# Patient Record
Sex: Female | Born: 1945
Health system: Southern US, Community
[De-identification: ages and names within clinical notes are randomized; demographics above are authoritative.]

## PROBLEM LIST (undated history)

## (undated) DIAGNOSIS — Z9109 Other allergy status, other than to drugs and biological substances: Secondary | ICD-10-CM

## (undated) DIAGNOSIS — D649 Anemia, unspecified: Secondary | ICD-10-CM

## (undated) DIAGNOSIS — M1990 Unspecified osteoarthritis, unspecified site: Secondary | ICD-10-CM

## (undated) DIAGNOSIS — G8929 Other chronic pain: Secondary | ICD-10-CM

## (undated) DIAGNOSIS — K589 Irritable bowel syndrome without diarrhea: Secondary | ICD-10-CM

## (undated) HISTORY — PX: SHOULDER ARTHROSCOPY: SHX128

## (undated) HISTORY — PX: DILATION AND CURETTAGE OF UTERUS: SHX78

## (undated) HISTORY — PX: BREAST SURGERY: SHX581

---

## 1969-03-12 HISTORY — PX: TONSILLECTOMY: SUR1361

## 1996-03-12 HISTORY — PX: ABDOMINAL HYSTERECTOMY: SHX81

## 1998-05-31 ENCOUNTER — Other Ambulatory Visit: Admission: RE | Admit: 1998-05-31 | Discharge: 1998-05-31 | Payer: Self-pay | Admitting: Obstetrics and Gynecology

## 1999-03-13 HISTORY — PX: APPENDECTOMY: SHX54

## 1999-06-20 ENCOUNTER — Other Ambulatory Visit: Admission: RE | Admit: 1999-06-20 | Discharge: 1999-06-20 | Payer: Self-pay | Admitting: Obstetrics and Gynecology

## 1999-09-20 ENCOUNTER — Ambulatory Visit (HOSPITAL_COMMUNITY): Admission: RE | Admit: 1999-09-20 | Discharge: 1999-09-20 | Payer: Self-pay | Admitting: Gastroenterology

## 1999-10-19 ENCOUNTER — Encounter: Payer: Self-pay | Admitting: *Deleted

## 1999-10-19 ENCOUNTER — Inpatient Hospital Stay (HOSPITAL_COMMUNITY): Admission: EM | Admit: 1999-10-19 | Discharge: 1999-10-20 | Payer: Self-pay | Admitting: *Deleted

## 1999-10-24 ENCOUNTER — Encounter: Admission: RE | Admit: 1999-10-24 | Discharge: 1999-10-24 | Payer: Self-pay | Admitting: Sports Medicine

## 2003-08-02 ENCOUNTER — Other Ambulatory Visit: Admission: RE | Admit: 2003-08-02 | Discharge: 2003-08-02 | Payer: Self-pay | Admitting: Obstetrics and Gynecology

## 2004-01-22 ENCOUNTER — Ambulatory Visit (HOSPITAL_COMMUNITY): Admission: RE | Admit: 2004-01-22 | Discharge: 2004-01-22 | Payer: Self-pay | Admitting: Sports Medicine

## 2008-07-17 ENCOUNTER — Emergency Department (HOSPITAL_COMMUNITY): Admission: EM | Admit: 2008-07-17 | Discharge: 2008-07-17 | Payer: Self-pay | Admitting: Emergency Medicine

## 2010-06-20 LAB — URINALYSIS, ROUTINE W REFLEX MICROSCOPIC
Bilirubin Urine: NEGATIVE
Glucose, UA: NEGATIVE mg/dL
Hgb urine dipstick: NEGATIVE
Ketones, ur: NEGATIVE mg/dL
Nitrite: NEGATIVE
Protein, ur: NEGATIVE mg/dL
Specific Gravity, Urine: 1.028 (ref 1.005–1.030)
Urobilinogen, UA: 0.2 mg/dL (ref 0.0–1.0)
pH: 5 (ref 5.0–8.0)

## 2010-07-28 NOTE — Discharge Summary (Signed)
[place]. [facility]  Patient:    April Simmons, April Simmons                       MRN: [id number] Adm. Date:  [id number] Disc. Date: [id number] Attending:  Garnette Scheuermann                           Discharge Summary  DISCHARGE DIAGNOSIS:  Acute appendicitis.  DISCHARGE MEDICATIONS:  Vicodin one to two tablets every four hours p.r.n. for pain.  DIET:  Regular.  CONDITION ON DISCHARGE:  Stable.  ACTIVITY:  As tolerated.  WOUND CARE:  She can shower and pat her wound dry.  HISTORY OF PRESENT ILLNESS:  The patient is a 65 year old female with abdominal pain and an unusual history for acute appendicitis.  A CT scan was done which showed some thickening of her small bowel loops along the terminal ileum and of her colon and a possible thickened appendix.  With these findings, it was decided that a laparoscopic examination and appendectomy would be required.  HOSPITAL COURSE:  She was taken to the OR where she was found to have at thickened appendix with possible appendolith and mild inflammation, maybe early acute appendicitis.  No other inflammation was noted.  A laparoscopic appendectomy was done without event.  Subsequently she did well, voiding well and eating well with her pain being relieved from the procedure.  DISPOSITION:  She was discharged home on Vicodin only and she will return to see me on October 31, 1999. DD:  10/20/99 TD:  10/21/99 Job: [id number]

## 2010-07-28 NOTE — H&P (Signed)
[place]. [facility]  Patient:    April Simmons, April Simmons                       MRN: [id number] Adm. Date:  [id number] Disc. Date: [id number] Attending:  Garnette Scheuermann Dictator:   Kinnie Scales Reed Breech, M.D.                         History and Physical  ACUTE PROBLEMS: 1. Right lower quadrant pain. 2. Vomiting. 3. Diarrhea.  CHRONIC PROBLEMS: 1. Status post surgical menopause hysterectomy in 1998. 2. Status post tonsillectomy and adenoidectomy. 3. Status post bilateral tubal ligation in 1990s. 4. Four breast surgeries for fibroadenoma by Dr. Maryagnes Amos. 5. Status post colonoscopy three weeks ago which was reported to be clear. 6. Status post three dilatation and curettages. 7. Two vaginal deliveries.  CHIEF COMPLAINT:  Abdominal pain, vomiting and diarrhea.  HISTORY OF PRESENT ILLNESS:  A pleasant 65 year old female with one week of intermittent right lower quadrant pain associated with this a.m. diarrhea, gas and profound discomfort.  These problems were difficult for her tolerate during this time and she is not taking any pain medications.  She was evaluated this a.m. at [facility] and sent for further evaluation at [facility].  This a.m. her pain was worse than she has experienced and she also had yellow emesis without hematemesis noted. Diarrhea is described with each of these episodes to be greater than five to six each time and soft to watery consistency without bilious stool.  She received Toradol this a.m. at [facility] with some relief.  PAST MEDICAL HISTORY:  As above.  MEDICATIONS:  Estrace, Citrucel, vitamin E, multivitamin and fiber.  ALLERGIES:  Codeine causes upset stomach.  SOCIAL HISTORY:  No tobacco or alcohol use.  Two children. Married for 29 years.  FAMILY HISTORY:  Mother died of Parkinsons, breast cancer and diabetes mellitus.  Father died of myocardial infarction at age 35. Brother died of MI at age  33.  REVIEW OF SYSTEMS:  Positive for chills.  No fevers. No myalgias.  Positive vomiting today.  Minimal nausea.  No weight changes.  Anorexia for the last several days.  No headaches and as above.  PHYSICAL EXAMINATION:  VITAL SIGNS:  Temperature 97.0, blood pressure 127/73, pulse 72, respirations 20, oxygen saturation 96% on room air.  GENERAL: Pleasant in moderate discomfort.  HEENT: PERRLA. EOMI.  Oropharynx nonerythematous.  NECK:  No lymphadenopathy in anterior, posterior, or cervical chain. No JVD.  CHEST: Clear to auscultation bilaterally.  CARDIOVASCULAR: Regular rate and rhythm without murmurs.  Normal S1 and S2.  ABDOMEN: Positive right lower quadrant tenderness over McBurneys point. Positive bowel sounds.  Negative psoas sign and obturator sign.  No hepatosplenomegaly.  RECTAL:  Normal tone. No hemorrhoids.  Fecal occult blood test.  PELVIC:  Bimanual positive tenderness right lower quadrant.  EXTREMITIES: No clubbing, cyanosis or edema.  LABORATORY DATA:  WBC 16.4.  Platelets 228.  Sodium 137.  Potassium 3.7. Chloride 98.  Bicarbonate 32.  BUN 17.  Creatinine 0.9.  Glucose 123.  Total bilirubin 0.6.  AST 27.  ALT 28. Alkaline phosphatase 62.  Amylase 70.  Lipase 28.  CT scan pending.  ASSESSMENT/PLAN:  A 65 year old female with abdominal pain.  1. Abdominal pain, right lower quadrant.  Suspect appendicitis especially    with chronicity, increased WBCs and location without radiation. Other    considerations  are Crohns, fecalith, bowel obstruction, bowel irritation    and ischemia, cecal diverticulitis, psoas abscess.  We will await the    CT scan and call her surgeon, Dr. Maryagnes Amos our partner for evaluation.    She also has had a total hysterectomy with bilateral salpingo-oophorectomy    and thus gynecology etiology is less likely.  For pain we will treat    with Toradol and offer her morphine sulfate p.r.n. 2. Fluids, electrolytes and nutrition.  IV fluid for  rehydration.    Electrolytes okay at this point.  Can follow up with that. DD:  10/20/99 TD:  10/20/99 Job: [id number]

## 2010-07-28 NOTE — Procedures (Signed)
[place]. [facility]  Patient:    April Simmons, April Simmons                       MRN: [id number] Proc. Date: 09/20/99 Adm. Date:  [id number] Attending:  Charna Elizabeth CC:         Beather Arbour. Thomasena Edis, M.D.                           Procedure Report  DATE OF BIRTH:  [birth year]  REFERRING PHYSICIAN:  Beather Arbour. Thomasena Edis, M.D.  PROCEDURE PERFORMED:  Colonoscopy.  ENDOSCOPIST:  Anselmo Rod, M.D.  INSTRUMENT USED:  Olympus video colonoscope.  INDICATIONS:  A 65 year old female with a history of breast cancer in the family and blood in stool.  Rule out colonic polyps, masses, hemorrhoids, etc.  PREPROCEDURE PREPARATION:  Informed consent was procured from the patient. The patient was fasted for 8 hours prior to the procedure and prepped with a bottle of magnesium citrate and a gallon of NuLytely the night prior to the procedure.  PREPROCEDURE PHYSICAL:  Patient has stable vital signs.  NECK: Supple.  CHEST:  Clear to auscultation. S1, S2 regular.  ABDOMEN:  Soft with normal abdominal bowel sounds.  DESCRIPTION OF PROCEDURE:  The patient was placed in left lateral decubitus position and sedated with 75 mg of Demerol and 7 mg of Versed intravenously. Once the patient was adequately sedated and maintained on low-flow oxygen and continuous cardiac monitoring, the Olympus video colonoscope was advanced from the rectum to the cecum without difficulty.  No ulceration, erosions, masses, polyps or diverticula were seen.  There was no evidence of hemorrhoids.  The patient tolerated the procedure well without complication.  IMPRESSION:  Normal colonoscopy.  RECOMMENDATIONS:  Outpatient follow-up was advised for repeat guaiac testing. Further recommendations will be made at that time. DD:  09/20/99 TD:  09/20/99 Job: [id number]

## 2010-07-28 NOTE — Op Note (Signed)
[place]. [facility]  Patient:    April Simmons, April Simmons                       MRN: [id number] Proc. Date: 10/19/99 Adm. Date:  [id number] Disc. Date: [id number] Attending:  Garnette Scheuermann CC:         Anselmo Rod, M.D.   Operative Report  PREOPERATIVE DIAGNOSIS:  Possible early appendicitis and/or inflammatory bowel disease.  POSTOPERATIVE DIAGNOSIS:  Early acute appendicitis.  OPERATION PERFORMED:  Laparoscopic appendectomy.  SURGEON:  Jimmye Norman, M.D.  ASSISTANT:  None.  ANESTHESIA:  General endotracheal.  ESTIMATED BLOOD LOSS:  Less than 10 cc.  COMPLICATIONS:  None.  CONDITION:  Stable.  INDICATIONS FOR PROCEDURE:  The patient is a 65 year old female with abdominal pain in the right lower quadrant associated with nausea, no fevers and a white blood cell count of 16,000.  CT scan suggested acute appendicitis.  She was taken to the operating room for diagnostic laparoscopy and laparoscopic appendectomy.  FINDINGS:  The patient had a thickened, firm appendix, possible early acute appendicitis with some type of appendolith.  There was no serosal exudate. There was minimal fluid in the pelvis or in the right lower quadrant.  The inflammatory findings noted on CT scan were not necessarily present at the time of surgery.  DESCRIPTION OF PROCEDURE:  The patient was taken to the operating room and placed on the table in supine position.  After an adequate general anesthetic was administered, she was prepped and draped in the usual sterile manner exposing the midline, right upper quadrant and right lower quadrant of the abdomen.  A superumbilical curvilinear incision was made with the patient in Trendelenburg position down to the midline fascia through which a Veress needle was passed into the peritoneal cavity while tenting it up on the anterior abdominal wall with sharp towel clamps.  Once this was done, the Veress needle was confirmed to  be in adequate position using the saline test. We then insufflated carbon dioxide gas into the intraperitoneal cavity up to a maximal intra-abdominal pressure of 15 mmHg.  Once this was done, the right upper quadrant 5 mm cannula was passed into the peritoneal cavity.  With the superumbilical cannula which had been confirmed to be in position with the laparoscope ____________ camera and light source was in place and the 5 mm cannula, we were able to mobilize the cecum well enough into the site of the laparoscope and the camera in order to identify the appendix.  It was noted that the appendix did appear to be firm and enlarged and therefore, the superpubic 11-12 mm cannula was passed into the peritoneal cavity under direct vision.  There was minimal difficulty with that.  An adaptor was placed.  Using the appropriate instruments, the base of the appendix, the mesoappendix and the base of the appendix were dissected out.  We passed a vascular endoclip around the mesoappendix and stapled it off and transected it and then a GIA stapler along the base of the cecum and the appendix stapling it off. We then brought it out through the 10-11 mm cannula without contaminating the underlying subcutaneous tissues.  Once this was done, we irrigated the right lower quadrant with copious amounts of saline.  There was some bleeding from the superpubic catheter and cannula site and this was not enough in order to cauterize or suture.  Once we had the appendix out, we allowed all gas to escape  from the abdomen after irrigating the right lower quadrant with saline. Once this was done we injected all sites with 0.25% Marcaine with epinephrine and closed the skin using a running subcuticular stitch of 5-0 Vicryl suture. All sponge, needle and instrument counts were correct. DD:  10/19/99 TD:  10/20/99 Job: [id number]

## 2010-07-28 NOTE — Consult Note (Signed)
[place]. [facility]  Patient:    April Simmons, April Simmons                       MRN: [id number] Proc. Date: 10/19/99 Adm. Date:  [id number] Attending:  Garnette Scheuermann Dictator:   [id number] CC:         Sheppard Penton. Stacie Acres, M.D.  Kinnie Scales. Reed Breech, M.D.   Consultation Report  (TIME DICTATED: 1740)  REASON FOR CONSULTATION: The patient is a 65 year old woman with right lower quadrant abdominal pain and elevated WBC.  HISTORY OF PRESENT ILLNESS: The patient was in her usual state of good health until today when she developed abdominal pain at approximately 6 a.m. associated with multiple episodes of watery diarrhea.  She also experienced some chills, questionable fever, and right lower quadrant pain.  With these findings her pain increased.  It was not cramping type pain and did not seem to get better with bowel movement or with nausea.  She developed pain so severe that she could not lay down flat, which tended to worsen the pain.  She went to her primary care physicians office, who gave her a shot of Toradol and then sent her to the ER for evaluation for possible acute appendicitis.  PAST MEDICAL HISTORY: Pretty much unremarkable.  She has fibrocystic changes in the breast and has had multiple fibroadenomas removed.  She has otherwise been healthy.  She had a colonoscopy approximately three weeks to a month ago by Dr. Anselmo Rod, which was negative.  PAST SURGICAL HISTORY:  1. Tonsillectomy and adenoidectomy.  2. Dilation and curettage x 3.  3. Hysterectomy and total salpingo-oophorectomy.  4. Breast surgeries as mentioned previously.  5. Two spontaneous vaginal deliveries.  MEDICATIONS:  1. Estrace.  2. Citracal.  3. Vitamin E.  4. Multivitamin.  ALLERGIES: No known drug allergies.  PHYSICAL EXAMINATION:  VITAL SIGNS: Afebrile now, vital signs stable.  GENERAL: Her pain now is only 2/10 but she has received two doses of Toradol.  HEENT: Head  normocephalic, atraumatic.  Sclerae anicteric.  NECK: Supple.  CHEST: Clear.  ABDOMEN: Soft, though she does have some mild grimacing when palpated in the right lower quadrant.  Tenderness is more medial and inferior to the usual McBurneys point.  PELVIC: Examination by the ED physicians and medical resident demonstrated slight right adnexal tenderness and right lower quadrant pain.  RECTAL: Unremarkable.  LABORATORY DATA: WBC 16,000 with left shift, hematocrit normal, platelet count normal.  Electrolytes within normal limits.  Liver function tests normal. Amylase and lipase normal.  IMPRESSION: Very likely acute appendicitis in this 65 year old female; however, her story is atypical and her pain is not in the place you would expect it to be.  She currently has an appetite but has not eaten anything. She will benefit from CT scan being done to rule out acute appendicitis, and this is currently in the process. DD:  10/19/99 TD:  10/20/99 Job: [id number]

## 2010-11-08 ENCOUNTER — Ambulatory Visit (HOSPITAL_BASED_OUTPATIENT_CLINIC_OR_DEPARTMENT_OTHER): Payer: 59

## 2011-04-02 DIAGNOSIS — N760 Acute vaginitis: Secondary | ICD-10-CM | POA: Insufficient documentation

## 2012-02-19 DIAGNOSIS — N760 Acute vaginitis: Secondary | ICD-10-CM | POA: Diagnosis not present

## 2012-02-19 DIAGNOSIS — N898 Other specified noninflammatory disorders of vagina: Secondary | ICD-10-CM | POA: Diagnosis not present

## 2012-03-13 DIAGNOSIS — M722 Plantar fascial fibromatosis: Secondary | ICD-10-CM | POA: Diagnosis not present

## 2012-03-13 DIAGNOSIS — I10 Essential (primary) hypertension: Secondary | ICD-10-CM | POA: Diagnosis not present

## 2012-03-20 DIAGNOSIS — M722 Plantar fascial fibromatosis: Secondary | ICD-10-CM | POA: Diagnosis not present

## 2012-03-24 DIAGNOSIS — M722 Plantar fascial fibromatosis: Secondary | ICD-10-CM | POA: Diagnosis not present

## 2012-03-26 DIAGNOSIS — M722 Plantar fascial fibromatosis: Secondary | ICD-10-CM | POA: Diagnosis not present

## 2012-03-27 DIAGNOSIS — M722 Plantar fascial fibromatosis: Secondary | ICD-10-CM | POA: Diagnosis not present

## 2012-03-28 DIAGNOSIS — M722 Plantar fascial fibromatosis: Secondary | ICD-10-CM | POA: Diagnosis not present

## 2012-03-31 DIAGNOSIS — M722 Plantar fascial fibromatosis: Secondary | ICD-10-CM | POA: Diagnosis not present

## 2012-04-02 DIAGNOSIS — M722 Plantar fascial fibromatosis: Secondary | ICD-10-CM | POA: Diagnosis not present

## 2012-04-04 DIAGNOSIS — M722 Plantar fascial fibromatosis: Secondary | ICD-10-CM | POA: Diagnosis not present

## 2012-04-07 DIAGNOSIS — M722 Plantar fascial fibromatosis: Secondary | ICD-10-CM | POA: Diagnosis not present

## 2012-04-11 DIAGNOSIS — M722 Plantar fascial fibromatosis: Secondary | ICD-10-CM | POA: Diagnosis not present

## 2012-04-14 DIAGNOSIS — M722 Plantar fascial fibromatosis: Secondary | ICD-10-CM | POA: Diagnosis not present

## 2012-04-18 DIAGNOSIS — M722 Plantar fascial fibromatosis: Secondary | ICD-10-CM | POA: Diagnosis not present

## 2012-05-14 DIAGNOSIS — L821 Other seborrheic keratosis: Secondary | ICD-10-CM | POA: Diagnosis not present

## 2012-05-14 DIAGNOSIS — L600 Ingrowing nail: Secondary | ICD-10-CM | POA: Diagnosis not present

## 2012-05-14 DIAGNOSIS — I781 Nevus, non-neoplastic: Secondary | ICD-10-CM | POA: Diagnosis not present

## 2012-05-14 DIAGNOSIS — D239 Other benign neoplasm of skin, unspecified: Secondary | ICD-10-CM | POA: Diagnosis not present

## 2012-05-15 DIAGNOSIS — N951 Menopausal and female climacteric states: Secondary | ICD-10-CM | POA: Diagnosis not present

## 2012-05-15 DIAGNOSIS — N898 Other specified noninflammatory disorders of vagina: Secondary | ICD-10-CM | POA: Diagnosis not present

## 2012-05-15 DIAGNOSIS — N760 Acute vaginitis: Secondary | ICD-10-CM | POA: Diagnosis not present

## 2012-05-27 DIAGNOSIS — L03039 Cellulitis of unspecified toe: Secondary | ICD-10-CM | POA: Diagnosis not present

## 2012-05-27 DIAGNOSIS — L600 Ingrowing nail: Secondary | ICD-10-CM | POA: Diagnosis not present

## 2012-07-21 DIAGNOSIS — Z1231 Encounter for screening mammogram for malignant neoplasm of breast: Secondary | ICD-10-CM | POA: Diagnosis not present

## 2012-08-06 DIAGNOSIS — K1370 Unspecified lesions of oral mucosa: Secondary | ICD-10-CM | POA: Diagnosis not present

## 2012-08-12 DIAGNOSIS — M5412 Radiculopathy, cervical region: Secondary | ICD-10-CM | POA: Diagnosis not present

## 2012-08-12 DIAGNOSIS — M719 Bursopathy, unspecified: Secondary | ICD-10-CM | POA: Diagnosis not present

## 2012-08-12 DIAGNOSIS — M67919 Unspecified disorder of synovium and tendon, unspecified shoulder: Secondary | ICD-10-CM | POA: Diagnosis not present

## 2012-08-22 DIAGNOSIS — M5412 Radiculopathy, cervical region: Secondary | ICD-10-CM | POA: Diagnosis not present

## 2012-08-28 DIAGNOSIS — M47812 Spondylosis without myelopathy or radiculopathy, cervical region: Secondary | ICD-10-CM | POA: Diagnosis not present

## 2012-09-03 DIAGNOSIS — M47812 Spondylosis without myelopathy or radiculopathy, cervical region: Secondary | ICD-10-CM | POA: Diagnosis not present

## 2012-09-11 DIAGNOSIS — M47812 Spondylosis without myelopathy or radiculopathy, cervical region: Secondary | ICD-10-CM | POA: Diagnosis not present

## 2012-09-23 DIAGNOSIS — M719 Bursopathy, unspecified: Secondary | ICD-10-CM | POA: Diagnosis not present

## 2012-09-23 DIAGNOSIS — M67919 Unspecified disorder of synovium and tendon, unspecified shoulder: Secondary | ICD-10-CM | POA: Diagnosis not present

## 2012-09-23 DIAGNOSIS — M542 Cervicalgia: Secondary | ICD-10-CM | POA: Diagnosis not present

## 2012-09-29 DIAGNOSIS — N898 Other specified noninflammatory disorders of vagina: Secondary | ICD-10-CM | POA: Diagnosis not present

## 2012-09-29 DIAGNOSIS — N760 Acute vaginitis: Secondary | ICD-10-CM | POA: Diagnosis not present

## 2012-09-29 DIAGNOSIS — N951 Menopausal and female climacteric states: Secondary | ICD-10-CM | POA: Diagnosis not present

## 2012-09-30 DIAGNOSIS — M5020 Other cervical disc displacement, unspecified cervical region: Secondary | ICD-10-CM | POA: Diagnosis not present

## 2012-09-30 DIAGNOSIS — M542 Cervicalgia: Secondary | ICD-10-CM | POA: Diagnosis not present

## 2012-10-07 DIAGNOSIS — M542 Cervicalgia: Secondary | ICD-10-CM | POA: Diagnosis not present

## 2012-10-07 DIAGNOSIS — M5020 Other cervical disc displacement, unspecified cervical region: Secondary | ICD-10-CM | POA: Diagnosis not present

## 2012-10-09 DIAGNOSIS — M5020 Other cervical disc displacement, unspecified cervical region: Secondary | ICD-10-CM | POA: Diagnosis not present

## 2012-10-09 DIAGNOSIS — M542 Cervicalgia: Secondary | ICD-10-CM | POA: Diagnosis not present

## 2012-10-09 DIAGNOSIS — Z780 Asymptomatic menopausal state: Secondary | ICD-10-CM | POA: Diagnosis not present

## 2012-10-10 DIAGNOSIS — M5412 Radiculopathy, cervical region: Secondary | ICD-10-CM | POA: Diagnosis not present

## 2012-10-10 DIAGNOSIS — M47812 Spondylosis without myelopathy or radiculopathy, cervical region: Secondary | ICD-10-CM | POA: Diagnosis not present

## 2012-10-17 DIAGNOSIS — M47812 Spondylosis without myelopathy or radiculopathy, cervical region: Secondary | ICD-10-CM | POA: Diagnosis not present

## 2012-11-14 DIAGNOSIS — M47812 Spondylosis without myelopathy or radiculopathy, cervical region: Secondary | ICD-10-CM | POA: Diagnosis not present

## 2012-11-18 DIAGNOSIS — M542 Cervicalgia: Secondary | ICD-10-CM | POA: Diagnosis not present

## 2012-11-18 DIAGNOSIS — Z23 Encounter for immunization: Secondary | ICD-10-CM | POA: Diagnosis not present

## 2012-11-18 DIAGNOSIS — G4700 Insomnia, unspecified: Secondary | ICD-10-CM | POA: Diagnosis not present

## 2012-11-18 DIAGNOSIS — Z791 Long term (current) use of non-steroidal anti-inflammatories (NSAID): Secondary | ICD-10-CM | POA: Diagnosis not present

## 2012-11-21 DIAGNOSIS — Z791 Long term (current) use of non-steroidal anti-inflammatories (NSAID): Secondary | ICD-10-CM | POA: Diagnosis not present

## 2012-11-21 DIAGNOSIS — G4700 Insomnia, unspecified: Secondary | ICD-10-CM | POA: Diagnosis not present

## 2012-11-21 DIAGNOSIS — E781 Pure hyperglyceridemia: Secondary | ICD-10-CM | POA: Diagnosis not present

## 2012-11-21 DIAGNOSIS — M542 Cervicalgia: Secondary | ICD-10-CM | POA: Diagnosis not present

## 2012-12-01 DIAGNOSIS — M542 Cervicalgia: Secondary | ICD-10-CM | POA: Diagnosis not present

## 2012-12-02 ENCOUNTER — Other Ambulatory Visit: Payer: Self-pay | Admitting: Neurological Surgery

## 2012-12-08 DIAGNOSIS — E7800 Pure hypercholesterolemia, unspecified: Secondary | ICD-10-CM | POA: Diagnosis not present

## 2012-12-08 DIAGNOSIS — M542 Cervicalgia: Secondary | ICD-10-CM | POA: Diagnosis not present

## 2012-12-08 DIAGNOSIS — G4700 Insomnia, unspecified: Secondary | ICD-10-CM | POA: Diagnosis not present

## 2012-12-29 ENCOUNTER — Encounter (HOSPITAL_COMMUNITY): Payer: Self-pay | Admitting: Pharmacy Technician

## 2012-12-30 ENCOUNTER — Ambulatory Visit (HOSPITAL_COMMUNITY)
Admission: RE | Admit: 2012-12-30 | Discharge: 2012-12-30 | Disposition: A | Discharge status: Home / Self Care | Payer: Medicare Other | Source: Ambulatory Visit | Attending: Neurological Surgery | Admitting: Neurological Surgery

## 2012-12-30 ENCOUNTER — Encounter (HOSPITAL_COMMUNITY): Payer: Self-pay

## 2012-12-30 ENCOUNTER — Encounter (HOSPITAL_COMMUNITY)
Admission: RE | Admit: 2012-12-30 | Discharge: 2012-12-30 | Disposition: A | Discharge status: Home / Self Care | Payer: Medicare Other | Source: Ambulatory Visit | Attending: Neurological Surgery | Admitting: Neurological Surgery

## 2012-12-30 DIAGNOSIS — I498 Other specified cardiac arrhythmias: Secondary | ICD-10-CM | POA: Diagnosis not present

## 2012-12-30 DIAGNOSIS — I252 Old myocardial infarction: Secondary | ICD-10-CM | POA: Diagnosis not present

## 2012-12-30 DIAGNOSIS — Z01818 Encounter for other preprocedural examination: Secondary | ICD-10-CM | POA: Diagnosis not present

## 2012-12-30 DIAGNOSIS — Z01812 Encounter for preprocedural laboratory examination: Secondary | ICD-10-CM | POA: Insufficient documentation

## 2012-12-30 HISTORY — DX: Unspecified osteoarthritis, unspecified site: M19.90

## 2012-12-30 HISTORY — DX: Other allergy status, other than to drugs and biological substances: Z91.09

## 2012-12-30 LAB — BASIC METABOLIC PANEL
BUN: 18 mg/dL (ref 6–23)
CO2: 25 mEq/L (ref 19–32)
Calcium: 10.4 mg/dL (ref 8.4–10.5)
Chloride: 102 mEq/L (ref 96–112)
Creatinine, Ser: 0.97 mg/dL (ref 0.50–1.10)
GFR calc Af Amer: 70 mL/min — ABNORMAL LOW (ref >90)
GFR calc non Af Amer: 60 mL/min — ABNORMAL LOW (ref >90)
Glucose, Bld: 86 mg/dL (ref 70–99)
Potassium: 3.7 mEq/L (ref 3.5–5.1)
Sodium: 138 mEq/L (ref 135–145)

## 2012-12-30 LAB — CBC WITH DIFFERENTIAL/PLATELET
Basophils Absolute: 0 10*3/uL (ref 0.0–0.1)
Basophils Relative: 0 % (ref 0–1)
Eosinophils Absolute: 0.2 10*3/uL (ref 0.0–0.7)
Eosinophils Relative: 3 % (ref 0–5)
HCT: 35.9 % — ABNORMAL LOW (ref 36.0–46.0)
Hemoglobin: 12.2 g/dL (ref 12.0–15.0)
Lymphocytes Relative: 41 % (ref 12–46)
Lymphs Abs: 1.9 10*3/uL (ref 0.7–4.0)
MCH: 29.8 pg (ref 26.0–34.0)
MCHC: 34 g/dL (ref 30.0–36.0)
MCV: 87.8 fL (ref 78.0–100.0)
Monocytes Absolute: 0.5 10*3/uL (ref 0.1–1.0)
Monocytes Relative: 10 % (ref 3–12)
Neutro Abs: 2.1 10*3/uL (ref 1.7–7.7)
Neutrophils Relative %: 46 % (ref 43–77)
Platelets: 236 10*3/uL (ref 150–400)
RBC: 4.09 MIL/uL (ref 3.87–5.11)
RDW: 13.6 % (ref 11.5–15.5)
WBC: 4.6 10*3/uL (ref 4.0–10.5)

## 2012-12-30 LAB — PROTIME-INR
INR: 0.88 (ref 0.00–1.49)
Prothrombin Time: 11.8 seconds (ref 11.6–15.2)

## 2012-12-30 LAB — SURGICAL PCR SCREEN
MRSA, PCR: NEGATIVE
Staphylococcus aureus: POSITIVE — AB

## 2012-12-30 NOTE — Pre-Procedure Instructions (Signed)
CASEY MAXFIELD  12/30/2012   Your procedure is scheduled on:  Monday , October 29 th at 0830 AM  Report to [facility]" and check in with admitting at 0630 AM.  Call this number if you have problems the morning of surgery: [phone]   Remember:   Do not eat food or drink liquids after midnight.   Take these medicines the morning of surgery with A SIP OF WATER: Tramadol if needed for pain               Stop all Aspirin, Herbal medications, NSAID's (Relafen), and Vitamins 7 days prior to surgery.   Do not wear jewelry, make-up or nail polish.  Do not wear lotions, powders, or perfumes. You may wear deodorant.  Do not shave 48 hours prior to surgery. Men may shave face and neck.  Do not bring valuables to the hospital.  [facility] is not responsible  for any belongings or valuables.               Contacts, dentures or bridgework may not be worn into surgery.  Leave suitcase in the car. After surgery it may be brought to your room.  For patients admitted to the hospital, discharge time is determined by your treatment team.               Patients discharged the day of surgery will not be allowed to drive  home.   Special Instructions: Shower using CHG 2 nights before surgery and the night before surgery.  If you shower the day of surgery use CHG.  Use special wash - you have one bottle of CHG for all showers.  You should use approximately 1/3 of the bottle for each shower.   Please read over the following fact sheets that you were given: Pain Booklet, Coughing and Deep Breathing, MRSA Information and Surgical Site Infection Prevention

## 2012-12-31 NOTE — Progress Notes (Addendum)
Anesthesia Chart Review:  Patient is a 67 year old female scheduled for C4-5, C5-6 ACDF on 01/07/13 by Dr. Yetta Barre.  History includes former smoker, arthritis, multiple environmental allergies, tonsillectomy, hysterectomy, appendectomy '01, breast surgery X 4 (non-specified).  PCP is listed as Dr. Merri Brunette.    Meds: Ester C, Citracal with D, Zyrtec, estradiol, Anusol-HC, MVI, nabumetone, Omega 3, Align, tramadol, Ambien.    EKG on 12/30/12 showed SB @ 55 bpm, low voltage QRS, septal infarct, age undetermined, non-specific ST/T wave changes.  Rate is slower when compared to her previous EKG on 10/19/99.  CXR on 12/30/12 showed minimal basilar opacity, likely atelectasis.  Preoperative labs noted.  Dr. Renne Crigler has reviewed both 2001 and 2014 EKGs.  He has recommended cardiology preoperative evaluation.  I spoke with his nurse Kennon Rounds.  She is going to contact patient regarding scheduling a cardiology appointment.  Erie Noe at Dr. Yetta Barre' office is aware.  Velna Ochs [facility] Short Stay Center/Anesthesiology Phone [phone] 12/31/2012 4:41 PM  Addendum: 01/05/13 10:35 AM Patient was evaluated by cardiologist Dr. Tobias Alexander earlier today.  Her note indicates, "There is no contraindication from cardiac standpoint for this patient to undergo intermediate risk surgery."  She did start patient on perioperative low dose metoprolol and recommended an echocardiogram in the future.

## 2013-01-01 ENCOUNTER — Telehealth: Payer: Self-pay | Admitting: Cardiology

## 2013-01-01 NOTE — Telephone Encounter (Signed)
**Note De-Identified April Simmons Obfuscation** Pt wants Dr Delton See to know that she has never had any cardiac issues in the past and that she is scheduled to have surgery on her neck on Wednesday (10/29) 2 days after her OV with Dr Delton See. Her concern is that her surgery may be postponed due to this surgical cardiac clearance visit. She states that her PCP is seeing something on her EKG but that "someone" from the hospital told her that her EKG was ok. The pt is advised that everything will be discussed with her at her office visit with Dr Delton See on 10/27. She verbalized understanding. Note forwarded to Dr Delton See as an Lorain Childes.

## 2013-01-01 NOTE — Telephone Encounter (Signed)
New problem:  Pt states she has surgery scheduled for Wednesday and her consult is scheduled for Monday. Pt states she wants to know what all the steps are for surgical clearance? Pt states she doesn't have a heart condition, she was just referred here from her PcP for a cardiology consult before surgery. Pt states she doesn't want anything to hold her back from getting her surgery done on Wednesday. Please advise

## 2013-01-05 ENCOUNTER — Ambulatory Visit (INDEPENDENT_AMBULATORY_CARE_PROVIDER_SITE_OTHER): Payer: Medicare Other | Admitting: Cardiology

## 2013-01-05 ENCOUNTER — Encounter: Payer: Self-pay | Admitting: Cardiology

## 2013-01-05 VITALS — BP 128/80 | HR 77 | Ht 65.0 in | Wt 169.4 lb

## 2013-01-05 DIAGNOSIS — R9431 Abnormal electrocardiogram [ECG] [EKG]: Secondary | ICD-10-CM

## 2013-01-05 MED ORDER — METOPROLOL TARTRATE 25 MG PO TABS: ORAL_TABLET | ORAL | Status: DC

## 2013-01-05 MED ORDER — METOPROLOL TARTRATE 25 MG PO TABS: 12.5000 mg | ORAL_TABLET | Freq: Two times a day (BID) | ORAL | Status: DC

## 2013-01-05 NOTE — Progress Notes (Signed)
Patient ID: April Simmons, female   DOB: 1945-08-17, 67 y.o.   MRN: [id number]    Patient Name: April Simmons Date of Encounter: 01/05/2013  Primary Care Provider:  Londell Moh, MD Primary Cardiologist:  Tobias Alexander, H   Patient Profile  Preoperative evaluation  Problem List   Past Medical History  Diagnosis Date  . Multiple environmental allergies   . Arthritis    Past Surgical History  Procedure Laterality Date  . Abdominal hysterectomy  1998  . Tonsillectomy  1971  . Appendectomy  2001  . Dilation and curettage of uterus      x3  . Breast surgery  [phone]    x4    Allergies  No Known Allergies  HPI  67 year old female scheduled for C4-5, C5-6 ACDF on 01/07/13 by Dr. Yetta Barre. The patient has no prior cardiac history, no history of hypertension, diabetes, hyperlipidemia, and a very distant history of smoking. The patient only risk factor is her family history significant for coronary artery disease. Her father had a myocardial infarction in his 38s and died in his 42s. Her nephew just underwent a cardiac bypass surgery. The patient is very active, she visits aerobic classes twice a week, where she is able to exercise for 45 minutes without any limitations. She denies any chest pain or shortness of breath during her clock face or any other time. She also denies symptoms of orthopnea, paroxysmal nocturnal dyspnea, palpitation or prior syncope. She quit smoking 30 years ago.  Home Medications  Prior to Admission medications   Medication Sig Start Date End Date Taking? Authorizing Provider  cetirizine (ZYRTEC) 10 MG tablet Take 10 mg by mouth at bedtime.   Yes Historical Provider, MD  estradiol (VIVELLE-DOT) 0.0375 MG/24HR Place 1 patch onto the skin 2 (two) times a week. Applies Mondays and Thursdays.   Yes Historical Provider, MD  traMADol (ULTRAM) 50 MG tablet Take 50 mg by mouth 2 (two) times daily as needed for pain.   Yes Historical Provider, MD    Bioflavonoid Products (ESTER C PO) Take 1 tablet by mouth daily.    Historical Provider, MD  Calcium Citrate-Vitamin D (CITRACAL + D PO) Take 1,260 mg by mouth 2 (two) times daily.    Historical Provider, MD  hydrocortisone (ANUSOL-HC) 25 MG suppository Place 25 mg vaginally every Monday. Inserts onto the vagina on Monday nights for vaginal inflammation.    Historical Provider, MD  Multiple Vitamin (MULTIVITAMIN WITH MINERALS) TABS tablet Take 1 tablet by mouth daily.    Historical Provider, MD  nabumetone (RELAFEN) 500 MG tablet Take 500 mg by mouth 2 (two) times daily.    Historical Provider, MD  Omega-3 Fatty Acids (FISH OIL) 1000 MG CAPS Take by mouth 2 (two) times daily.    Historical Provider, MD  Probiotic Product (ALIGN PO) Take 1 tablet by mouth daily.    Historical Provider, MD  zolpidem (AMBIEN) 10 MG tablet Take 10 mg by mouth at bedtime as needed for sleep.    Historical Provider, MD    Family History  No family history on file.  Social History  History   Social History  . Marital Status: Married    Spouse Name: N/A    Number of Children: N/A  . Years of Education: N/A   Occupational History  . Not on file.   Social History Main Topics  . Smoking status: Former Smoker -- 3 years    Types: Cigarettes    Quit date: 12/31/1978  .  Smokeless tobacco: Never Used  . Alcohol Use: Yes     Comment: wine rarely  . Drug Use: No  . Sexual Activity: Not on file   Other Topics Concern  . Not on file   Social History Narrative  . No narrative on file     Review of Systems General:  No chills, fever, night sweats or weight changes.  Cardiovascular:  No chest pain, dyspnea on exertion, edema, orthopnea, palpitations, paroxysmal nocturnal dyspnea. Dermatological: No rash, lesions/masses Respiratory: No cough, dyspnea Urologic: No hematuria, dysuria Abdominal:   No nausea, vomiting, diarrhea, bright red blood per rectum, April, or hematemesis Neurologic:  No visual  changes, wkns, changes in mental status. All other systems reviewed and are otherwise negative except as noted above.  Physical Exam  Blood pressure 128/80, pulse 77, height 5\' 5"  (1.651 m), weight 169 lb 6.4 oz (76.839 kg).  General: Pleasant, NAD Psych: Normal affect. Neuro: Alert and oriented X 3. Moves all extremities spontaneously. HEENT: Normal  Neck: Supple without bruits or JVD. Lungs:  Resp regular and unlabored, CTA. Heart: RRR no s3, s4, or murmurs. Abdomen: Soft, non-tender, non-distended, BS + x 4.  Extremities: No clubbing, cyanosis or edema. DP/PT/Radials 2+ and equal bilaterally.  Accessory Clinical Findings  ECG - SR, 69 BPM, possible inferior and anterolateral ischemia   Assessment & Plan  67 year old female who is being referred to [place] prior to cervical spine surgery because of concern of abnormal EKG. Her EKG has ST changes suggestive of possible ischemia however the patient is completely asymptomatic with high level of exercise, there are no signs of angina or heart failure.  There's no indication for a stress test or any other workup at this point.  We'll start her on metoprolol 12.5 mg by mouth twice a day daily that we want her to start taking today.  There is no contraindication from cardiac standpoint for this patient to undergo intermediate risk surgery.  The patient will followup in our clinic 6 weeks after her surgery. We will order an echocardiogram.  Thank you for referring this patient for clinic please call us if you have any questions.  Tobias Alexander, Rexene Edison, MD 01/05/2013, 9:21 AM

## 2013-01-05 NOTE — Patient Instructions (Signed)
**Note De-Identified Tajh Livsey Obfuscation** Your physician has requested that you have an echocardiogram. Echocardiography is a painless test that uses sound waves to create images of your heart. It provides your doctor with information about the size and shape of your heart and how well your heart's chambers and valves are working. This procedure takes approximately one hour. There are no restrictions for this procedure.  Your physician has recommended you make the following change in your medication: start taking Metoprolol 12.5 mg twice daily.  Your physician recommends that you schedule a follow-up appointment in: 6 weeks

## 2013-01-06 ENCOUNTER — Encounter (HOSPITAL_COMMUNITY): Payer: Self-pay | Admitting: Certified Registered Nurse Anesthetist

## 2013-01-06 MED ORDER — DEXAMETHASONE SODIUM PHOSPHATE 10 MG/ML IJ SOLN
10.0000 mg | INTRAMUSCULAR | Status: AC
  Administered 2013-01-07: 10 mg via INTRAVENOUS
  Filled 2013-01-06: qty 1

## 2013-01-06 MED ORDER — CEFAZOLIN SODIUM-DEXTROSE 2-3 GM-% IV SOLR
2.0000 g | INTRAVENOUS | Status: AC
  Administered 2013-01-07: 2 g via INTRAVENOUS
  Filled 2013-01-06: qty 50

## 2013-01-07 ENCOUNTER — Encounter (HOSPITAL_COMMUNITY): Payer: Medicare Other | Admitting: Vascular Surgery

## 2013-01-07 ENCOUNTER — Encounter (HOSPITAL_COMMUNITY)
Admission: RE | Disposition: A | Discharge status: Home / Self Care | Payer: Self-pay | Source: Ambulatory Visit | Attending: Neurological Surgery

## 2013-01-07 ENCOUNTER — Inpatient Hospital Stay (HOSPITAL_COMMUNITY): Payer: Medicare Other

## 2013-01-07 ENCOUNTER — Inpatient Hospital Stay (HOSPITAL_COMMUNITY)
Admission: RE | Admit: 2013-01-07 | Discharge: 2013-01-08 | DRG: 473 | Disposition: A | Discharge status: Home / Self Care | Payer: Medicare Other | Source: Ambulatory Visit | Attending: Neurological Surgery | Admitting: Neurological Surgery

## 2013-01-07 ENCOUNTER — Inpatient Hospital Stay (HOSPITAL_COMMUNITY): Payer: Medicare Other | Admitting: Certified Registered Nurse Anesthetist

## 2013-01-07 ENCOUNTER — Encounter (HOSPITAL_COMMUNITY): Payer: Self-pay | Admitting: *Deleted

## 2013-01-07 DIAGNOSIS — M129 Arthropathy, unspecified: Secondary | ICD-10-CM | POA: Diagnosis not present

## 2013-01-07 DIAGNOSIS — M5090 Cervical disc disorder, unspecified, unspecified cervical region: Secondary | ICD-10-CM | POA: Diagnosis not present

## 2013-01-07 DIAGNOSIS — Z981 Arthrodesis status: Secondary | ICD-10-CM

## 2013-01-07 DIAGNOSIS — M47812 Spondylosis without myelopathy or radiculopathy, cervical region: Secondary | ICD-10-CM | POA: Diagnosis not present

## 2013-01-07 DIAGNOSIS — Z87891 Personal history of nicotine dependence: Secondary | ICD-10-CM | POA: Diagnosis not present

## 2013-01-07 DIAGNOSIS — IMO0002 Reserved for concepts with insufficient information to code with codable children: Secondary | ICD-10-CM | POA: Diagnosis not present

## 2013-01-07 DIAGNOSIS — M4802 Spinal stenosis, cervical region: Secondary | ICD-10-CM | POA: Diagnosis not present

## 2013-01-07 HISTORY — PX: ANTERIOR CERVICAL DECOMP/DISCECTOMY FUSION: SHX1161

## 2013-01-07 SURGERY — ANTERIOR CERVICAL DECOMPRESSION/DISCECTOMY FUSION 2 LEVELS
Anesthesia: General | Wound class: Clean

## 2013-01-07 MED ORDER — NEOSTIGMINE METHYLSULFATE 1 MG/ML IJ SOLN
INTRAMUSCULAR | Status: DC | PRN
  Administered 2013-01-07: 3 mg via INTRAVENOUS

## 2013-01-07 MED ORDER — CEFAZOLIN SODIUM 1-5 GM-% IV SOLN
1.0000 g | Freq: Three times a day (TID) | INTRAVENOUS | Status: AC
  Administered 2013-01-07 – 2013-01-08 (×2): 1 g via INTRAVENOUS
  Filled 2013-01-07 (×2): qty 50

## 2013-01-07 MED ORDER — OXYCODONE HCL 5 MG PO TABS
5.0000 mg | ORAL_TABLET | Freq: Once | ORAL | Status: AC | PRN
  Administered 2013-01-07: 5 mg via ORAL

## 2013-01-07 MED ORDER — ACETAMINOPHEN 650 MG RE SUPP: 650.0000 mg | RECTAL | Status: DC | PRN

## 2013-01-07 MED ORDER — THROMBIN 5000 UNITS EX SOLR
OROMUCOSAL | Status: DC | PRN
  Administered 2013-01-07: 09:00:00 via TOPICAL

## 2013-01-07 MED ORDER — HYDROMORPHONE HCL PF 1 MG/ML IJ SOLN
0.2500 mg | INTRAMUSCULAR | Status: DC | PRN
  Administered 2013-01-07 (×3): 0.5 mg via INTRAVENOUS

## 2013-01-07 MED ORDER — MENTHOL 3 MG MT LOZG: 1.0000 | LOZENGE | OROMUCOSAL | Status: DC | PRN

## 2013-01-07 MED ORDER — LACTATED RINGERS IV SOLN
INTRAVENOUS | Status: DC | PRN
  Administered 2013-01-07 (×2): via INTRAVENOUS

## 2013-01-07 MED ORDER — POTASSIUM CHLORIDE IN NACL 20-0.9 MEQ/L-% IV SOLN
INTRAVENOUS | Status: DC
  Filled 2013-01-07 (×3): qty 1000

## 2013-01-07 MED ORDER — DEXAMETHASONE SODIUM PHOSPHATE 4 MG/ML IJ SOLN
4.0000 mg | Freq: Four times a day (QID) | INTRAMUSCULAR | Status: DC
  Filled 2013-01-07 (×4): qty 1

## 2013-01-07 MED ORDER — DEXAMETHASONE 4 MG PO TABS
4.0000 mg | ORAL_TABLET | Freq: Four times a day (QID) | ORAL | Status: DC
  Administered 2013-01-07 – 2013-01-08 (×3): 4 mg via ORAL
  Filled 2013-01-07 (×7): qty 1

## 2013-01-07 MED ORDER — OXYCODONE-ACETAMINOPHEN 5-325 MG PO TABS
1.0000 | ORAL_TABLET | ORAL | Status: DC | PRN
  Administered 2013-01-07 – 2013-01-08 (×3): 1 via ORAL
  Filled 2013-01-07 (×3): qty 1

## 2013-01-07 MED ORDER — ONDANSETRON HCL 4 MG/2ML IJ SOLN
INTRAMUSCULAR | Status: DC | PRN
  Administered 2013-01-07: 4 mg via INTRAVENOUS

## 2013-01-07 MED ORDER — PROPOFOL 10 MG/ML IV BOLUS
INTRAVENOUS | Status: DC | PRN
  Administered 2013-01-07: 120 mg via INTRAVENOUS

## 2013-01-07 MED ORDER — SODIUM CHLORIDE 0.9 % IR SOLN
Status: DC | PRN
  Administered 2013-01-07: 09:00:00

## 2013-01-07 MED ORDER — SODIUM CHLORIDE 0.9 % IJ SOLN
3.0000 mL | Freq: Two times a day (BID) | INTRAMUSCULAR | Status: DC
  Administered 2013-01-07: 3 mL via INTRAVENOUS

## 2013-01-07 MED ORDER — PHENOL 1.4 % MT LIQD: 1.0000 | OROMUCOSAL | Status: DC | PRN

## 2013-01-07 MED ORDER — METHOCARBAMOL 500 MG PO TABS: 500.0000 mg | ORAL_TABLET | Freq: Four times a day (QID) | ORAL | Status: DC | PRN

## 2013-01-07 MED ORDER — GLYCOPYRROLATE 0.2 MG/ML IJ SOLN
INTRAMUSCULAR | Status: DC | PRN
  Administered 2013-01-07: 0.6 mg via INTRAVENOUS
  Administered 2013-01-07 (×2): 0.1 mg via INTRAVENOUS

## 2013-01-07 MED ORDER — ONDANSETRON HCL 4 MG/2ML IJ SOLN: 4.0000 mg | INTRAMUSCULAR | Status: DC | PRN

## 2013-01-07 MED ORDER — HYDROMORPHONE HCL PF 1 MG/ML IJ SOLN
INTRAMUSCULAR | Status: AC
  Filled 2013-01-07: qty 1

## 2013-01-07 MED ORDER — METHOCARBAMOL 100 MG/ML IJ SOLN
500.0000 mg | Freq: Four times a day (QID) | INTRAVENOUS | Status: DC | PRN
  Filled 2013-01-07: qty 5

## 2013-01-07 MED ORDER — THROMBIN 5000 UNITS EX SOLR
CUTANEOUS | Status: DC | PRN
  Administered 2013-01-07 (×2): 5000 [IU] via TOPICAL

## 2013-01-07 MED ORDER — LIDOCAINE HCL (CARDIAC) 20 MG/ML IV SOLN
INTRAVENOUS | Status: DC | PRN
  Administered 2013-01-07: 40 mg via INTRAVENOUS

## 2013-01-07 MED ORDER — HYDROMORPHONE HCL PF 1 MG/ML IJ SOLN
INTRAMUSCULAR | Status: AC
  Administered 2013-01-07: 0.5 mg via INTRAVENOUS
  Filled 2013-01-07: qty 1

## 2013-01-07 MED ORDER — OXYCODONE HCL 5 MG PO TABS
ORAL_TABLET | ORAL | Status: AC
  Filled 2013-01-07: qty 1

## 2013-01-07 MED ORDER — SODIUM CHLORIDE 0.9 % IR SOLN
Status: DC | PRN
  Administered 2013-01-07: 1000 mL

## 2013-01-07 MED ORDER — ONDANSETRON HCL 4 MG/2ML IJ SOLN
INTRAMUSCULAR | Status: AC
  Filled 2013-01-07: qty 2

## 2013-01-07 MED ORDER — FENTANYL CITRATE 0.05 MG/ML IJ SOLN
INTRAMUSCULAR | Status: DC | PRN
  Administered 2013-01-07: 100 ug via INTRAVENOUS
  Administered 2013-01-07 (×3): 50 ug via INTRAVENOUS

## 2013-01-07 MED ORDER — ROCURONIUM BROMIDE 100 MG/10ML IV SOLN
INTRAVENOUS | Status: DC | PRN
  Administered 2013-01-07: 50 mg via INTRAVENOUS

## 2013-01-07 MED ORDER — HEMOSTATIC AGENTS (NO CHARGE) OPTIME
TOPICAL | Status: DC | PRN
  Administered 2013-01-07: 1 via TOPICAL

## 2013-01-07 MED ORDER — OXYCODONE HCL 5 MG/5ML PO SOLN: 5.0000 mg | Freq: Once | ORAL | Status: AC | PRN

## 2013-01-07 MED ORDER — MORPHINE SULFATE 2 MG/ML IJ SOLN: 1.0000 mg | INTRAMUSCULAR | Status: DC | PRN

## 2013-01-07 MED ORDER — EPHEDRINE SULFATE 50 MG/ML IJ SOLN
INTRAMUSCULAR | Status: DC | PRN
  Administered 2013-01-07: 5 mg via INTRAVENOUS

## 2013-01-07 MED ORDER — SODIUM CHLORIDE 0.9 % IJ SOLN: 3.0000 mL | INTRAMUSCULAR | Status: DC | PRN

## 2013-01-07 MED ORDER — MIDAZOLAM HCL 5 MG/5ML IJ SOLN
INTRAMUSCULAR | Status: DC | PRN
  Administered 2013-01-07: 2 mg via INTRAVENOUS

## 2013-01-07 MED ORDER — ACETAMINOPHEN 325 MG PO TABS: 650.0000 mg | ORAL_TABLET | ORAL | Status: DC | PRN

## 2013-01-07 MED ORDER — BUPIVACAINE HCL (PF) 0.25 % IJ SOLN
INTRAMUSCULAR | Status: DC | PRN
  Administered 2013-01-07: 5 mL

## 2013-01-07 SURGICAL SUPPLY — 54 items
BAG DECANTER FOR FLEXI CONT (MISCELLANEOUS) ×2 IMPLANT
BENZOIN TINCTURE PRP APPL 2/3 (GAUZE/BANDAGES/DRESSINGS) ×2 IMPLANT
BUR MATCHSTICK NEURO 3.0 LAGG (BURR) ×2 IMPLANT
CAGE LORDOTIC 8 SM PLUS (Cage) ×2 IMPLANT
CAGE NIKO 21X12X14 3.5DEG (Cage) ×2 IMPLANT
CAGE SMALL 7X13X15 (Cage) ×2 IMPLANT
CANISTER SUCT 3000ML (MISCELLANEOUS) ×2 IMPLANT
CONT SPEC 4OZ CLIKSEAL STRL BL (MISCELLANEOUS) ×2 IMPLANT
DRAPE C-ARM 42X72 X-RAY (DRAPES) ×4 IMPLANT
DRAPE LAPAROTOMY 100X72 PEDS (DRAPES) ×2 IMPLANT
DRAPE MICROSCOPE ZEISS OPMI (DRAPES) ×2 IMPLANT
DRAPE POUCH INSTRU U-SHP 10X18 (DRAPES) ×2 IMPLANT
DRESSING TELFA 8X3 (GAUZE/BANDAGES/DRESSINGS) ×2 IMPLANT
DRILL BIT HELIX (BIT) ×2 IMPLANT
DRSG OPSITE 4X5.5 SM (GAUZE/BANDAGES/DRESSINGS) ×2 IMPLANT
DRSG OPSITE POSTOP 3X4 (GAUZE/BANDAGES/DRESSINGS) ×4 IMPLANT
DURAPREP 6ML APPLICATOR 50/CS (WOUND CARE) ×2 IMPLANT
ELECT COATED BLADE 2.86 ST (ELECTRODE) ×2 IMPLANT
ELECT REM PT RETURN 9FT ADLT (ELECTROSURGICAL) ×2
ELECTRODE REM PT RTRN 9FT ADLT (ELECTROSURGICAL) ×1 IMPLANT
GAUZE SPONGE 4X4 16PLY XRAY LF (GAUZE/BANDAGES/DRESSINGS) IMPLANT
GLOVE BIO SURGEON STRL SZ8 (GLOVE) ×8 IMPLANT
GLOVE BIOGEL PI IND STRL 7.0 (GLOVE) ×1 IMPLANT
GLOVE BIOGEL PI INDICATOR 7.0 (GLOVE) ×1
GLOVE INDICATOR 8.5 STRL (GLOVE) ×4 IMPLANT
GLOVE SURG SS PI 7.0 STRL IVOR (GLOVE) ×4 IMPLANT
GOWN BRE IMP SLV AUR LG STRL (GOWN DISPOSABLE) IMPLANT
GOWN BRE IMP SLV AUR XL STRL (GOWN DISPOSABLE) ×8 IMPLANT
GOWN STRL REIN 2XL LVL4 (GOWN DISPOSABLE) IMPLANT
HEMOSTAT POWDER KIT SURGIFOAM (HEMOSTASIS) ×2 IMPLANT
KIT BASIN OR (CUSTOM PROCEDURE TRAY) ×2 IMPLANT
KIT ROOM TURNOVER OR (KITS) ×2 IMPLANT
NEEDLE HYPO 25X1 1.5 SAFETY (NEEDLE) ×2 IMPLANT
NEEDLE SPNL 20GX3.5 QUINCKE YW (NEEDLE) ×2 IMPLANT
NS IRRIG 1000ML POUR BTL (IV SOLUTION) ×2 IMPLANT
PACK LAMINECTOMY NEURO (CUSTOM PROCEDURE TRAY) ×2 IMPLANT
PAD ARMBOARD 7.5X6 YLW CONV (MISCELLANEOUS) ×6 IMPLANT
PLATE R HELIX 38MM (Plate) ×2 IMPLANT
PUTTY ABX ACTIFUSE 1.5ML (Putty) ×2 IMPLANT
RUBBERBAND STERILE (MISCELLANEOUS) ×4 IMPLANT
SCREW 4.0X13 (Screw) ×6 IMPLANT
SCREW 4.0X13MM (Screw) ×6 IMPLANT
SCREW HELIX R SELF TAP 11MM (Screw) IMPLANT
SCREW HELIX SELF-TAP 4.5X11MM (Screw) ×2 IMPLANT
SCREW VARIABLE 4.5X13 (Screw) ×6 IMPLANT
SPONGE INTESTINAL PEANUT (DISPOSABLE) ×2 IMPLANT
SPONGE SURGIFOAM ABS GEL SZ50 (HEMOSTASIS) ×2 IMPLANT
STRIP CLOSURE SKIN 1/2X4 (GAUZE/BANDAGES/DRESSINGS) ×2 IMPLANT
SUT VIC AB 3-0 SH 8-18 (SUTURE) ×4 IMPLANT
SYR 20ML ECCENTRIC (SYRINGE) ×2 IMPLANT
TOWEL OR 17X24 6PK STRL BLUE (TOWEL DISPOSABLE) ×2 IMPLANT
TOWEL OR 17X26 10 PK STRL BLUE (TOWEL DISPOSABLE) ×2 IMPLANT
TRAP SPECIMEN MUCOUS 40CC (MISCELLANEOUS) ×2 IMPLANT
WATER STERILE IRR 1000ML POUR (IV SOLUTION) ×2 IMPLANT

## 2013-01-07 NOTE — Preoperative (Signed)
Beta Blockers   Reason not to administer Beta Blockers:lopressor 25mg  10-29

## 2013-01-07 NOTE — Progress Notes (Signed)
Orthopedic Tech Progress Note Patient Details:  April Simmons August 11, 1945 [id number] Applied soft cervical collar. Ortho Devices Type of Ortho Device: Soft collar Ortho Device/Splint Interventions: Application   [name] 01/07/2013, 12:25 PM

## 2013-01-07 NOTE — Transfer of Care (Signed)
Immediate Anesthesia Transfer of Care Note  Patient: April Simmons  Procedure(s) Performed: Procedure(s) with comments: Anterior Cervical Decompression Fusion Cervical Four-Five, Five-Six (N/A) - Anterior Cervical Decompression Fusion Cervical Four-Five, Five-Six  Patient Location: PACU  Anesthesia Type:General  Level of Consciousness: awake, alert  and oriented  Airway & Oxygen Therapy: Patient Spontanous Breathing and Patient connected to nasal cannula oxygen  Post-op Assessment: Report given to PACU RN, Post -op Vital signs reviewed and stable and Patient moving all extremities X 4  Post vital signs: Reviewed and stable  Complications: No apparent anesthesia complications

## 2013-01-07 NOTE — Anesthesia Procedure Notes (Signed)
Procedure Name: Intubation Date/Time: 01/07/2013 8:41 AM Performed by: Tia Alert Pre-anesthesia Checklist: Patient identified, Timeout performed, Emergency Drugs available, Suction available and Patient being monitored Patient Re-evaluated:Patient Re-evaluated prior to inductionOxygen Delivery Method: Circle system utilized and Simple face mask Preoxygenation: Pre-oxygenation with 100% oxygen Intubation Type: IV induction Ventilation: Mask ventilation without difficulty Laryngoscope Size: Miller and 2 Grade View: Grade III Tube type: Oral Tube size: 7.0 mm Number of attempts: 1 Airway Equipment and Method: Patient positioned with wedge pillow and Stylet Placement Confirmation: ETT inserted through vocal cords under direct vision,  positive ETCO2 and breath sounds checked- equal and bilateral Secured at: 22 cm Tube secured with: Tape Dental Injury: Teeth and Oropharynx as per pre-operative assessment

## 2013-01-07 NOTE — Progress Notes (Signed)
UR COMPLETED  

## 2013-01-07 NOTE — Anesthesia Postprocedure Evaluation (Signed)
  Anesthesia Post-op Note  Patient: April Simmons  Procedure(s) Performed: Procedure(s) with comments: Anterior Cervical Decompression Fusion Cervical Four-Five, Five-Six (N/A) - Anterior Cervical Decompression Fusion Cervical Four-Five, Five-Six  Patient Location: PACU  Anesthesia Type:General  Level of Consciousness: awake and alert   Airway and Oxygen Therapy: Patient Spontanous Breathing  Post-op Pain: moderate  Post-op Assessment: Post-op Vital signs reviewed, Patient's Cardiovascular Status Stable and Respiratory Function Stable  Post-op Vital Signs: Reviewed  Filed Vitals:   01/07/13 1245  BP:   Pulse: 60  Temp:   Resp: 12    Complications: No apparent anesthesia complications

## 2013-01-07 NOTE — H&P (Signed)
Subjective:   Patient is a 67 y.o. female admitted for ACDF. The patient first presented to me with complaints of neck pain. Onset of symptoms was several years ago. The pain is described as dull and throbbing and occurs all day. The pain is rated severe, and is located across the neck and radiates to the right shoulder and arm. The symptoms have been progressive. Symptoms are exacerbated by bending chin toward chest, and are relieved by none.  Previous work up includes MRI of cervical spine, results: disc bulge at C4-C5 and C5-C6 bilateral.  Past Medical History  Diagnosis Date  . Multiple environmental allergies   . Arthritis     Past Surgical History  Procedure Laterality Date  . Abdominal hysterectomy  1998  . Tonsillectomy  1971  . Appendectomy  2001  . Dilation and curettage of uterus      x3  . Breast surgery  [phone]    x4    Allergies  Allergen Reactions  . Adhesive [Tape] Itching    History  Substance Use Topics  . Smoking status: Former Smoker -- 3 years    Types: Cigarettes    Quit date: 12/31/1978  . Smokeless tobacco: Never Used  . Alcohol Use: Yes     Comment: wine rarely    History reviewed. No pertinent family history. Prior to Admission medications   Medication Sig Start Date End Date Taking? Authorizing Provider  Bioflavonoid Products (ESTER C PO) Take 1 tablet by mouth daily.   Yes Historical Provider, MD  Calcium Citrate-Vitamin D (CITRACAL + D PO) Take 1,260 mg by mouth 2 (two) times daily.   Yes Historical Provider, MD  cetirizine (ZYRTEC) 10 MG tablet Take 10 mg by mouth at bedtime.   Yes Historical Provider, MD  estradiol (VIVELLE-DOT) 0.0375 MG/24HR Place 1 patch onto the skin 2 (two) times a week. Applies Mondays and Thursdays.   Yes Historical Provider, MD  hydrocortisone (ANUSOL-HC) 25 MG suppository Place 25 mg vaginally every Monday. Inserts onto the vagina on Monday nights for vaginal inflammation.   Yes Historical Provider, MD  metoprolol  tartrate (LOPRESSOR) 25 MG tablet Take until 1 week after sugery that is scheduled for 01/07/13 then discontinue. 01/05/13  Yes Lars Masson, MD  Multiple Vitamin (MULTIVITAMIN WITH MINERALS) TABS tablet Take 1 tablet by mouth daily.   Yes Historical Provider, MD  nabumetone (RELAFEN) 500 MG tablet Take 500 mg by mouth 2 (two) times daily.   Yes Historical Provider, MD  Omega-3 Fatty Acids (FISH OIL) 1000 MG CAPS Take by mouth 2 (two) times daily.   Yes Historical Provider, MD  Probiotic Product (ALIGN PO) Take 1 tablet by mouth daily.   Yes Historical Provider, MD  traMADol (ULTRAM) 50 MG tablet Take 50 mg by mouth 2 (two) times daily as needed for pain.   Yes Historical Provider, MD  zolpidem (AMBIEN) 10 MG tablet Take 10 mg by mouth at bedtime as needed for sleep.   Yes Historical Provider, MD     Review of Systems  Positive ROS: Negative  All other systems have been reviewed and were otherwise negative with the exception of those mentioned in the HPI and as above.  Objective: Vital signs in last 24 hours: Temp:  [97 F (36.1 C)] 97 F (36.1 C) (10/29 0711) Pulse Rate:  [54] 54 (10/29 0711) Resp:  [16] 16 (10/29 0711) BP: (137)/(81) 137/81 mmHg (10/29 0711) SpO2:  [97 %] 97 % (10/29 0711)  General Appearance: Alert, cooperative,  no distress, appears stated age Head: Normocephalic, without obvious abnormality, atraumatic Eyes: PERRL, conjunctiva/corneas clear, EOM'Simmons intact      Neck: Supple, symmetrical, trachea midline, Back: Symmetric, no curvature, ROM normal, no CVA tenderness Lungs:  respirations unlabored Heart: Regular rate and rhythm Abdomen: Soft, non-tender Extremities: Extremities normal, atraumatic, no cyanosis or edema Pulses: 2+ and symmetric all extremities Skin: Skin color, texture, turgor normal, no rashes or lesions  NEUROLOGIC:  Mental status: Alert and oriented x4, no aphasia, good attention span, fund of knowledge and memory  Motor Exam - grossly  normal Sensory Exam - grossly normal Reflexes: 1+ Coordination - grossly normal Gait - grossly normal Balance - grossly normal Cranial Nerves: I: smell Not tested  II: visual acuity  OS: nl    OD: nl  II: visual fields Full to confrontation  II: pupils Equal, round, reactive to light  III,VII: ptosis None  III,IV,VI: extraocular muscles  Full ROM  V: mastication Normal  V: facial light touch sensation  Normal  V,VII: corneal reflex  Present  VII: facial muscle function - upper  Normal  VII: facial muscle function - lower Normal  VIII: hearing Not tested  IX: soft palate elevation  Normal  IX,X: gag reflex Present  XI: trapezius strength  5/5  XI: sternocleidomastoid strength 5/5  XI: neck flexion strength  5/5  XII: tongue strength  Normal    Data Review Lab Results  Component Value Date   WBC 4.6 12/30/2012   HGB 12.2 12/30/2012   HCT 35.9* 12/30/2012   MCV 87.8 12/30/2012   PLT 236 12/30/2012   Lab Results  Component Value Date   NA 138 12/30/2012   K 3.7 12/30/2012   CL 102 12/30/2012   CO2 25 12/30/2012   BUN 18 12/30/2012   CREATININE 0.97 12/30/2012   GLUCOSE 86 12/30/2012   Lab Results  Component Value Date   INR 0.88 12/30/2012    Assessment:   Cervical neck pain with herniated nucleus pulposus/ spondylosis/ stenosis at C4-5 and C5-6. Patient has failed conservative therapy. Planned surgery : ACDF with plating C4-5 C5-6  Plan:   I explained the condition and procedure to the patient and answered any questions.  Patient wishes to proceed with procedure as planned. Understands risks/ benefits/ and expected or typical outcomes.  April Simmons 01/07/2013 8:29 AM

## 2013-01-07 NOTE — Op Note (Signed)
01/07/2013  12:09 PM  PATIENT:  April Simmons  67 y.o. female  PRE-OPERATIVE DIAGNOSIS:  Cervical spondylosis with instability at C4-5 and spinal stenosis at C5-6 with neck and right arm pain  POST-OPERATIVE DIAGNOSIS:  Same  PROCEDURE:  1. C5 corpectomy for decompression of the central canal and neural foramina, 2. Anterior cervical arthrodesis C4-C6 utilizing a 21 mm peek strut graft packed with local autograft and morcellized allograft, 3. Anterior cervical plating C4-C6 utilizing a helix plate  SURGEON:  Marikay Alar, MD  ASSISTANTS: Dr. Wynetta Emery  ANESTHESIA:   General  EBL: 75 ml  Total I/O In: 1600 [I.V.:1600] Out: 75 [Blood:75]  BLOOD ADMINISTERED:none  DRAINS: None   SPECIMEN:  No Specimen  INDICATION FOR PROCEDURE: This patient presented with significant neck pain with right upper extremity pain. MRI and plain films showed cervical spondylosis at C4-5 and C5-6. She tried medical management without relief. Recommended decompressive anterior cervical discectomy with plating C4-5 and C5-6. Patient understood the risks, benefits, and alternatives and potential outcomes and wished to proceed.  PROCEDURE DETAILS: Patient was brought to the operating room placed under general endotracheal anesthesia. Patient was placed in the supine position on the operating room table. The neck was prepped with Duraprep and draped in a sterile fashion.   Three cc of local anesthesia was injected and a transverse incision was made on the right side of the neck.  Dissection was carried down thru the subcutaneous tissue and the platysma was  elevated, opened, and undermined with Metzenbaum scissors.  Dissection was then carried out thru an avascular plane leaving the sternocleidomastoid carotid artery and jugular vein laterally and the trachea and esophagus medially. The ventral aspect of the vertebral column was identified and a localizing x-ray was taken. The C5-6 level was identified. The longus colli  muscles were then elevated and the retractor was placed to expose C4-5 and C5-6. The annulus was incised at C4-5 and C5-6 and the disc space entered. Discectomy was performed with micro-curettes and pituitary rongeurs. I then used the high-speed drill to drill the endplates down to the level of the posterior longitudinal ligament. The drill shavings were saved in a mucous trap for later arthrodesis. The operating microscope was draped and brought into the field provided additional magnification, illumination and visualization. Discectomy was continued posteriorly thru the disc space. Posterior longitudinal ligament was opened with a nerve hook, and then removed along with disc herniation and osteophytes, decompressing the spinal canal and thecal sac. We then continued to remove osteophytic overgrowth and disc material decompressing the neural foramina and exiting nerve roots bilaterally. The scope was angled up and down to help decompress and undercut the vertebral bodies. Once the decompression was completed we could pass a nerve hook circumferentially to assure adequate decompression in the midline and in the neural foramina. So by both visualization and palpation we felt we had an adequate decompression of the neural elements. We then measured the height of the intravertebral disc space and selected a 8 mm millimeter Peek interbody cage packed with autograft and morcellized allograft. It was then gently positioned in the intravertebral disc space and countersunk at C4-5. A 7 mm graft was used at C5-6. I then used a 38 mm helix plate and placed variable angle screws into the vertebral bodies of C4, C5, and C6 and locked them into position. When looking at our lateral fluoroscopic film our C5-6 graft looked to be 1-2 mm to deep. It did not appear to be causing canal  stenosis or neural compression but I felt that then was the time to reposition the graft. Therefore the removal tool was used to remove the screws from  the C4, C5, and C6 vertebral bodies. The plate was removed. Unfortunately the C5 vertebral body was fractured either from the original placement of the screws or the removal of the screws. I removed the 2 peek interbody cages and decided that the best option was to perform a corpectomy of C5 at this point. Therefore used the high-speed drill and the Kerrison punches to perform a C5 corpectomy. I was then able to measure the interspace to be 19 mm and chose a 21 mm peek interbody strut graft and packed this with a local autograft and morcellized allograft and tapped this into position at C4-C6. This had a nice snug fit and looked good on lateral C-arm fluoroscopy . A 36 mm anterior cervical plate was then placed with rescue screws. The final construct looked good on fluoroscopy. The wound was irrigated with bacitracin solution, checked for hemostasis which was established and confirmed. Once meticulous hemostasis was achieved, we then proceeded with closure. The platysma was closed with interrupted 3-0 undyed Vicryl suture, the subcuticular layer was closed with interrupted 3-0 undyed Vicryl suture. The skin edges were approximated with steristrips. The drapes were removed. A sterile dressing was applied. The patient was then awakened from general anesthesia and transferred to the recovery room in stable condition. At the end of the procedure all sponge, needle and instrument counts were correct.   PLAN OF CARE: Admit to inpatient   PATIENT DISPOSITION:  PACU - hemodynamically stable.   Delay start of Pharmacological VTE agent (>24hrs) due to surgical blood loss or risk of bleeding:  yes

## 2013-01-07 NOTE — Plan of Care (Signed)
Problem: Consults Goal: Diagnosis - Spinal Surgery Outcome: Completed/Met Date Met:  01/07/13 Cervical Spine Fusion     

## 2013-01-07 NOTE — Anesthesia Preprocedure Evaluation (Addendum)
Anesthesia Evaluation  Patient identified by MRN, date of birth, ID band Patient awake    Reviewed: Allergy & Precautions, H&P , NPO status , Patient's Chart, lab work & pertinent test results, reviewed documented beta blocker date and time   Airway Mallampati: III TM Distance: >3 FB Neck ROM: Full    Dental no notable dental hx. (+) Teeth Intact and Dental Advisory Given   Pulmonary neg pulmonary ROS,  breath sounds clear to auscultation  Pulmonary exam normal       Cardiovascular negative cardio ROS  Rhythm:Regular Rate:Normal     Neuro/Psych negative neurological ROS  negative psych ROS   GI/Hepatic negative GI ROS, Neg liver ROS,   Endo/Other  negative endocrine ROS  Renal/GU negative Renal ROS  negative genitourinary   Musculoskeletal   Abdominal   Peds  Hematology negative hematology ROS (+)   Anesthesia Other Findings   Reproductive/Obstetrics negative OB ROS                          Anesthesia Physical Anesthesia Plan  ASA: II  Anesthesia Plan: General   Post-op Pain Management:    Induction: Intravenous  Airway Management Planned: Oral ETT  Additional Equipment:   Intra-op Plan:   Post-operative Plan: Extubation in OR  Informed Consent: I have reviewed the patients History and Physical, chart, labs and discussed the procedure including the risks, benefits and alternatives for the proposed anesthesia with the patient or authorized representative who has indicated his/her understanding and acceptance.   Dental advisory given  Plan Discussed with: CRNA  Anesthesia Plan Comments:         Anesthesia Quick Evaluation

## 2013-01-08 MED ORDER — METHOCARBAMOL 500 MG PO TABS: 500.0000 mg | ORAL_TABLET | Freq: Four times a day (QID) | ORAL | Status: DC | PRN

## 2013-01-08 MED ORDER — OXYCODONE-ACETAMINOPHEN 5-325 MG PO TABS: 1.0000 | ORAL_TABLET | ORAL | Status: DC | PRN

## 2013-01-08 NOTE — Discharge Summary (Signed)
Physician Discharge Summary  Patient ID: April Simmons MRN: [id number] DOB/AGE: 04-03-1945 67 y.o.  Admit date: 01/07/2013 Discharge date: 01/08/2013  Admission Diagnoses: cervical spondylosis   Discharge Diagnoses: same   Discharged Condition: good  Hospital Course: The patient was admitted on 01/07/2013 and taken to the operating room where the patient underwent C5 corpectomy. The patient tolerated the procedure well and was taken to the recovery room and then to the floor in stable condition. The hospital course was routine. There were no complications. The wound remained clean dry and intact. Pt had appropriate neck soreness. No complaints of arm pain or new N/T/W. The patient remained afebrile with stable vital signs, and tolerated a regular diet. The patient continued to increase activities, and pain was well controlled with oral pain medications.   Consults: None  Significant Diagnostic Studies:  Results for orders placed during the hospital encounter of 12/30/12  SURGICAL PCR SCREEN      Result Value Range   MRSA, PCR NEGATIVE  NEGATIVE   Staphylococcus aureus POSITIVE (*) NEGATIVE  BASIC METABOLIC PANEL      Result Value Range   Sodium 138  135 - 145 mEq/L   Potassium 3.7  3.5 - 5.1 mEq/L   Chloride 102  96 - 112 mEq/L   CO2 25  19 - 32 mEq/L   Glucose, Bld 86  70 - 99 mg/dL   BUN 18  6 - 23 mg/dL   Creatinine, Ser 4.09  0.50 - 1.10 mg/dL   Calcium 81.1  8.4 - 91.4 mg/dL   GFR calc non Af Amer 60 (*) >90 mL/min   GFR calc Af Amer 70 (*) >90 mL/min  CBC WITH DIFFERENTIAL      Result Value Range   WBC 4.6  4.0 - 10.5 K/uL   RBC 4.09  3.87 - 5.11 MIL/uL   Hemoglobin 12.2  12.0 - 15.0 g/dL   HCT 78.2 (*) 95.6 - 21.3 %   MCV 87.8  78.0 - 100.0 fL   MCH 29.8  26.0 - 34.0 pg   MCHC 34.0  30.0 - 36.0 g/dL   RDW 08.6  57.8 - 46.9 %   Platelets 236  150 - 400 K/uL   Neutrophils Relative % 46  43 - 77 %   Neutro Abs 2.1  1.7 - 7.7 K/uL   Lymphocytes Relative 41  12  - 46 %   Lymphs Abs 1.9  0.7 - 4.0 K/uL   Monocytes Relative 10  3 - 12 %   Monocytes Absolute 0.5  0.1 - 1.0 K/uL   Eosinophils Relative 3  0 - 5 %   Eosinophils Absolute 0.2  0.0 - 0.7 K/uL   Basophils Relative 0  0 - 1 %   Basophils Absolute 0.0  0.0 - 0.1 K/uL  PROTIME-INR      Result Value Range   Prothrombin Time 11.8  11.6 - 15.2 seconds   INR 0.88  0.00 - 1.49    Chest 2 View  12/30/2012   CLINICAL DATA:  Pre-admission for cervical spine surgery.  EXAM: CHEST  2 VIEW  COMPARISON:  None.  FINDINGS: The heart size and mediastinal contours are within normal limits. Minimal basilar opacity is seen at the costophrenic angle on the lateral view, likely atelectasis. No acute osseous abnormalities.  IMPRESSION: Minimal basilar opacity, likely atelectasis.   Electronically Signed   By: Jerene Dilling M.D.   On: 12/30/2012 11:11   Dg Cervical Spine 2-3 Views  01/07/2013   CLINICAL DATA:  C4-5, C5-6 ACDF  EXAM: CERVICAL SPINE - 2-3 VIEW  COMPARISON:  12/01/2012  FINDINGS: Multiple intraoperative spot images demonstrate anterior cervical plate extending from C4-C6. Screws are noted within the C4 and C6 vertebral bodies. The C6 vertebral body and below are difficult to visualize due to overlying shoulders. There appears to be normal alignment.  IMPRESSION: C4-C6 anterior fusion. No visible complicating feature.   Electronically Signed   By: Charlett Nose M.D.   On: 01/07/2013 14:59   Dg C-arm 1-60 Min  01/07/2013   CLINICAL DATA:  ACDF at C4-5 and C5-6  EXAM: DG C-ARM 1-60 MIN  FINDINGS: Three spot fluoroscopic images of the cervical spine are submitted. The 1st image shows interbody spacers at C4-5 and C5-C6. The 2nd and 3rd images demonstrate placement of an anterior cervical plate spanning C4 through C6, with screws in C4 and C6. Endotracheal tube is noted.  IMPRESSION: ACDF at C4-5 and C5-6.  : COMPARISON:  Cervical spine radiographs 12/01/2012   Electronically Signed   By: Britta Mccreedy  M.D.   On: 01/07/2013 15:41    Antibiotics:  Anti-infectives   Start     Dose/Rate Route Frequency Ordered Stop   01/07/13 1630  ceFAZolin (ANCEF) IVPB 1 g/50 mL premix     1 g 100 mL/hr over 30 Minutes Intravenous Every 8 hours 01/07/13 1531 01/08/13 0058   01/07/13 0916  bacitracin 50,000 Units in sodium chloride irrigation 0.9 % 500 mL irrigation  Status:  Discontinued       As needed 01/07/13 0917 01/07/13 1156   01/07/13 0600  ceFAZolin (ANCEF) IVPB 2 g/50 mL premix     2 g 100 mL/hr over 30 Minutes Intravenous On call to O.R. 01/06/13 1323 01/07/13 0850      Discharge Exam: Blood pressure 110/71, pulse 60, temperature 98.6 F (37 C), temperature source Oral, resp. rate 18, SpO2 95.00%. Neurologic: Grossly normal incision CDI  Discharge Medications:     Medication List    STOP taking these medications       nabumetone 500 MG tablet  Commonly known as:  RELAFEN      TAKE these medications       ALIGN PO  Take 1 tablet by mouth daily.     cetirizine 10 MG tablet  Commonly known as:  ZYRTEC  Take 10 mg by mouth at bedtime.     CITRACAL + D PO  Take 1,260 mg by mouth 2 (two) times daily.     ESTER C PO  Take 1 tablet by mouth daily.     estradiol 0.0375 MG/24HR  Commonly known as:  VIVELLE-DOT  Place 1 patch onto the skin 2 (two) times a week. Applies Mondays and Thursdays.     Fish Oil 1000 MG Caps  Take by mouth 2 (two) times daily.     hydrocortisone 25 MG suppository  Commonly known as:  ANUSOL-HC  Place 25 mg vaginally every Monday. Inserts onto the vagina on Monday nights for vaginal inflammation.     methocarbamol 500 MG tablet  Commonly known as:  ROBAXIN  Take 1 tablet (500 mg total) by mouth every 6 (six) hours as needed.     metoprolol tartrate 25 MG tablet  Commonly known as:  LOPRESSOR  Take until 1 week after sugery that is scheduled for 01/07/13 then discontinue.     multivitamin with minerals Tabs tablet  Take 1 tablet by mouth  daily.  oxyCODONE-acetaminophen 5-325 MG per tablet  Commonly known as:  PERCOCET/ROXICET  Take 1-2 tablets by mouth every 4 (four) hours as needed.     traMADol 50 MG tablet  Commonly known as:  ULTRAM  Take 50 mg by mouth 2 (two) times daily as needed for pain.     zolpidem 10 MG tablet  Commonly known as:  AMBIEN  Take 10 mg by mouth at bedtime as needed for sleep.        Disposition: home   Final Dx: C5 corpectomy      Discharge Orders   Future Appointments Provider Department Dept Phone   02/13/2013 10:30 AM [facility] Echo 2 [facility] S[facility] 3 ECHO LAB [phone]   02/18/2013 9:30 AM Lars Masson, MD [facility] Max Office [phone]   Future Orders Complete By Expires   Call MD for:  difficulty breathing, headache or visual disturbances  As directed    Call MD for:  persistant nausea and vomiting  As directed    Call MD for:  redness, tenderness, or signs of infection (pain, swelling, redness, odor or green/yellow discharge around incision site)  As directed    Call MD for:  severe uncontrolled pain  As directed    Call MD for:  temperature >100.4  As directed    Diet - low sodium heart healthy  As directed    Discharge instructions  As directed    Comments:     No strenuous activity, no lifting more than 8 lbs, no driving   Increase activity slowly  As directed       Follow-up Information   Follow up with Deasia Chiu S, MD In 2 weeks.   Specialty:  Neurosurgery   Contact information:   [address] [place] [address] [phone]        Signed: Tia Alert 01/08/2013, 8:00 AM

## 2013-01-08 NOTE — Progress Notes (Signed)
Pt given D/C instructions with Rx's, verbal understanding was given.  All questions were answered prior to D/C. Pt D/C'd home via wheelchair @ [phone] per MD order. Rema Fendt, RN

## 2013-01-13 ENCOUNTER — Encounter (HOSPITAL_COMMUNITY): Payer: Self-pay | Admitting: Neurological Surgery

## 2013-02-13 ENCOUNTER — Ambulatory Visit (HOSPITAL_COMMUNITY): Payer: Medicare Other | Attending: Cardiology | Admitting: Radiology

## 2013-02-13 DIAGNOSIS — R9431 Abnormal electrocardiogram [ECG] [EKG]: Secondary | ICD-10-CM | POA: Insufficient documentation

## 2013-02-13 DIAGNOSIS — Z87891 Personal history of nicotine dependence: Secondary | ICD-10-CM | POA: Diagnosis not present

## 2013-02-13 DIAGNOSIS — Z8249 Family history of ischemic heart disease and other diseases of the circulatory system: Secondary | ICD-10-CM | POA: Insufficient documentation

## 2013-02-13 NOTE — Progress Notes (Signed)
Echocardiogram performed.  

## 2013-02-17 DIAGNOSIS — M47812 Spondylosis without myelopathy or radiculopathy, cervical region: Secondary | ICD-10-CM | POA: Diagnosis not present

## 2013-02-18 ENCOUNTER — Encounter: Payer: Self-pay | Admitting: Cardiology

## 2013-02-18 ENCOUNTER — Ambulatory Visit (INDEPENDENT_AMBULATORY_CARE_PROVIDER_SITE_OTHER): Payer: Medicare Other | Admitting: Cardiology

## 2013-02-18 VITALS — BP 153/77 | HR 78 | Ht 65.0 in | Wt 162.0 lb

## 2013-02-18 DIAGNOSIS — R9431 Abnormal electrocardiogram [ECG] [EKG]: Secondary | ICD-10-CM

## 2013-02-18 DIAGNOSIS — I10 Essential (primary) hypertension: Secondary | ICD-10-CM | POA: Diagnosis not present

## 2013-02-18 DIAGNOSIS — E785 Hyperlipidemia, unspecified: Secondary | ICD-10-CM | POA: Diagnosis not present

## 2013-02-18 NOTE — Patient Instructions (Signed)
**Note De-identified April Simmons Obfuscation** Your physician recommends that you schedule a follow-up appointment in: as needed  

## 2013-02-18 NOTE — Progress Notes (Signed)
Patient ID: April Simmons, female   DOB: 09-17-1945, 67 y.o.   MRN: [id number] Patient ID: April Simmons, female   DOB: 1945-08-17, 67 y.o.   MRN: [id number]    Patient Name: April Simmons Date of Encounter: 02/18/2013  Primary Care Provider:  Londell Moh, MD Primary Cardiologist:  Tobias Alexander, H   Patient Profile  Preoperative evaluation  Problem List   Past Medical History  Diagnosis Date  . Multiple environmental allergies   . Arthritis    Past Surgical History  Procedure Laterality Date  . Abdominal hysterectomy  1998  . Tonsillectomy  1971  . Appendectomy  2001  . Dilation and curettage of uterus      x3  . Breast surgery  [phone]    x4  . Anterior cervical decomp/discectomy fusion N/A 01/07/2013    Procedure: Anterior Cervical Decompression Fusion Cervical Four-Five, Five-Six;  Surgeon: Tia Alert, MD;  Location: [facility] NEURO ORS;  Service: Neurosurgery;  Laterality: N/A;  Anterior Cervical Decompression Fusion Cervical Four-Five, Five-Six    Allergies  Allergies  Allergen Reactions  . Adhesive [Tape] Itching    HPI  67 year old female scheduled for C4-5, C5-6 ACDF on 01/07/13 by Dr. Yetta Barre. The patient has no prior cardiac history, no history of hypertension, diabetes, hyperlipidemia, and a very distant history of smoking. The patient only risk factor is her family history significant for coronary artery disease. Her father had a myocardial infarction in his 62s and died in his 44s. Her nephew just underwent a cardiac bypass surgery. The patient is very active, she visits aerobic classes twice a week, where she is able to exercise for 45 minutes without any limitations. She denies any chest pain or shortness of breath during her clock face or any other time. She also denies symptoms of orthopnea, paroxysmal nocturnal dyspnea, palpitation or prior syncope. She quit smoking 30 years ago.  The patient is coming after her surgery, she feels well with  some residual pain in her neck. She started with a light exercise but was recommended to wait for anorther 6 weeks to engage in more advanced physical therapy.  Home Medications  Prior to Admission medications   Medication Sig Start Date End Date Taking? Authorizing Provider  cetirizine (ZYRTEC) 10 MG tablet Take 10 mg by mouth at bedtime.   Yes Historical Provider, MD  estradiol (VIVELLE-DOT) 0.0375 MG/24HR Place 1 patch onto the skin 2 (two) times a week. Applies Mondays and Thursdays.   Yes Historical Provider, MD  traMADol (ULTRAM) 50 MG tablet Take 50 mg by mouth 2 (two) times daily as needed for pain.   Yes Historical Provider, MD  Bioflavonoid Products (ESTER C PO) Take 1 tablet by mouth daily.    Historical Provider, MD  Calcium Citrate-Vitamin D (CITRACAL + D PO) Take 1,260 mg by mouth 2 (two) times daily.    Historical Provider, MD  hydrocortisone (ANUSOL-HC) 25 MG suppository Place 25 mg vaginally every Monday. Inserts onto the vagina on Monday nights for vaginal inflammation.    Historical Provider, MD  Multiple Vitamin (MULTIVITAMIN WITH MINERALS) TABS tablet Take 1 tablet by mouth daily.    Historical Provider, MD  nabumetone (RELAFEN) 500 MG tablet Take 500 mg by mouth 2 (two) times daily.    Historical Provider, MD  Omega-3 Fatty Acids (FISH OIL) 1000 MG CAPS Take by mouth 2 (two) times daily.    Historical Provider, MD  Probiotic Product (ALIGN PO) Take 1 tablet by mouth daily.  Historical Provider, MD  zolpidem (AMBIEN) 10 MG tablet Take 10 mg by mouth at bedtime as needed for sleep.    Historical Provider, MD    Family History  No family history on file.  Social History  History   Social History  . Marital Status: Married    Spouse Name: N/A    Number of Children: N/A  . Years of Education: N/A   Occupational History  . Not on file.   Social History Main Topics  . Smoking status: Former Smoker -- 3 years    Types: Cigarettes    Quit date: 12/31/1978  .  Smokeless tobacco: Never Used  . Alcohol Use: Yes     Comment: wine rarely  . Drug Use: No  . Sexual Activity: Not on file   Other Topics Concern  . Not on file   Social History Narrative  . No narrative on file     Review of Systems General:  No chills, fever, night sweats or weight changes.  Cardiovascular:  No chest pain, dyspnea on exertion, edema, orthopnea, palpitations, paroxysmal nocturnal dyspnea. Dermatological: No rash, lesions/masses Respiratory: No cough, dyspnea Urologic: No hematuria, dysuria Abdominal:   No nausea, vomiting, diarrhea, bright red blood per rectum, melena, or hematemesis Neurologic:  No visual changes, wkns, changes in mental status. All other systems reviewed and are otherwise negative except as noted above.  Physical Exam  Blood pressure 153/77, pulse 78, height 5\' 5"  (1.651 m), weight 162 lb (73.483 kg).  General: Pleasant, NAD Psych: Normal affect. Neuro: Alert and oriented X 3. Moves all extremities spontaneously. HEENT: Normal  Neck: Supple without bruits or JVD. Lungs:  Resp regular and unlabored, CTA. Heart: RRR no s3, s4, or murmurs. Abdomen: Soft, non-tender, non-distended, BS + x 4.  Extremities: No clubbing, cyanosis or edema. DP/PT/Radials 2+ and equal bilaterally.  Accessory Clinical Findings  ECG - SR, 69 BPM, possible inferior and anterolateral ischemia  TTE 02/13/13 Study Conclusions  - Left ventricle: The cavity size was normal. There was mild concentric hypertrophy. Systolic function was normal. The estimated ejection fraction was in the range of 55% to 60%. Wall motion was normal; there were no regional wall motion abnormalities. Doppler parameters are consistent with abnormal left ventricular relaxation (grade 1 diastolic dysfunction). - Atrial septum: No defect or patent foramen ovale was identified. Impressions:  - Normal study.   Assessment & Plan  67 year old female who was referred to [place] prior to  cervical spine surgery because of concern of abnormal EKG. Her EKG has ST changes suggestive of possible ischemia however the patient is completely asymptomatic with high level of exercise, there are no signs of angina or heart failure.  We cleared the patient for the surgery and recommended a  low dose Metoprolol in the peri-operative period.  The surgery was uneventful and she is feeling well. She states that her BP is elevated at the doctor's visits but always controlled at home. Her recent echocardiogram shows preserved LV EF but grade I diastolic dysfunction with mild LVH suggesting long standing hypertension.  The patient was offered an initiation of an antihypertensive but she is very reluctant.  She also states that her cholesterol was elevated at her PCP office but she wants to try lifestyle modifications with exercise and weight loss first.  She will keep a diary of her BP and let us know if it is elevated.  She would like to follow with her PCP.  Please call us if you have any  questions or if  There are any changes to patient's symptoms.   Tobias Alexander, Rexene Edison, MD 02/18/2013, 2:46 PM

## 2013-03-10 DIAGNOSIS — M25519 Pain in unspecified shoulder: Secondary | ICD-10-CM | POA: Diagnosis not present

## 2013-05-01 DIAGNOSIS — M542 Cervicalgia: Secondary | ICD-10-CM | POA: Diagnosis not present

## 2013-05-01 DIAGNOSIS — Z6829 Body mass index (BMI) 29.0-29.9, adult: Secondary | ICD-10-CM | POA: Diagnosis not present

## 2013-05-01 DIAGNOSIS — M47812 Spondylosis without myelopathy or radiculopathy, cervical region: Secondary | ICD-10-CM | POA: Diagnosis not present

## 2013-05-22 DIAGNOSIS — L509 Urticaria, unspecified: Secondary | ICD-10-CM | POA: Diagnosis not present

## 2013-05-22 DIAGNOSIS — D235 Other benign neoplasm of skin of trunk: Secondary | ICD-10-CM | POA: Diagnosis not present

## 2013-07-16 DIAGNOSIS — S43429A Sprain of unspecified rotator cuff capsule, initial encounter: Secondary | ICD-10-CM | POA: Diagnosis not present

## 2013-07-22 DIAGNOSIS — Z1231 Encounter for screening mammogram for malignant neoplasm of breast: Secondary | ICD-10-CM | POA: Diagnosis not present

## 2013-07-22 DIAGNOSIS — Z803 Family history of malignant neoplasm of breast: Secondary | ICD-10-CM | POA: Diagnosis not present

## 2013-07-23 DIAGNOSIS — M19019 Primary osteoarthritis, unspecified shoulder: Secondary | ICD-10-CM | POA: Diagnosis not present

## 2013-07-27 DIAGNOSIS — M47812 Spondylosis without myelopathy or radiculopathy, cervical region: Secondary | ICD-10-CM | POA: Diagnosis not present

## 2013-07-27 DIAGNOSIS — M542 Cervicalgia: Secondary | ICD-10-CM | POA: Diagnosis not present

## 2013-07-27 DIAGNOSIS — Z6827 Body mass index (BMI) 27.0-27.9, adult: Secondary | ICD-10-CM | POA: Diagnosis not present

## 2013-08-12 DIAGNOSIS — E7800 Pure hypercholesterolemia, unspecified: Secondary | ICD-10-CM | POA: Diagnosis not present

## 2013-08-12 DIAGNOSIS — Z0000 Encounter for general adult medical examination without abnormal findings: Secondary | ICD-10-CM | POA: Diagnosis not present

## 2013-08-12 DIAGNOSIS — Z23 Encounter for immunization: Secondary | ICD-10-CM | POA: Diagnosis not present

## 2013-08-12 DIAGNOSIS — E781 Pure hyperglyceridemia: Secondary | ICD-10-CM | POA: Diagnosis not present

## 2013-08-12 DIAGNOSIS — D649 Anemia, unspecified: Secondary | ICD-10-CM | POA: Diagnosis not present

## 2013-08-19 DIAGNOSIS — E7800 Pure hypercholesterolemia, unspecified: Secondary | ICD-10-CM | POA: Diagnosis not present

## 2013-08-19 DIAGNOSIS — G4700 Insomnia, unspecified: Secondary | ICD-10-CM | POA: Diagnosis not present

## 2013-08-19 DIAGNOSIS — M542 Cervicalgia: Secondary | ICD-10-CM | POA: Diagnosis not present

## 2013-08-19 DIAGNOSIS — Z0000 Encounter for general adult medical examination without abnormal findings: Secondary | ICD-10-CM | POA: Diagnosis not present

## 2013-08-19 DIAGNOSIS — Z791 Long term (current) use of non-steroidal anti-inflammatories (NSAID): Secondary | ICD-10-CM | POA: Diagnosis not present

## 2013-08-27 DIAGNOSIS — H2510 Age-related nuclear cataract, unspecified eye: Secondary | ICD-10-CM | POA: Diagnosis not present

## 2013-09-30 DIAGNOSIS — Z01419 Encounter for gynecological examination (general) (routine) without abnormal findings: Secondary | ICD-10-CM | POA: Diagnosis not present

## 2013-09-30 DIAGNOSIS — N760 Acute vaginitis: Secondary | ICD-10-CM | POA: Diagnosis not present

## 2013-09-30 DIAGNOSIS — N951 Menopausal and female climacteric states: Secondary | ICD-10-CM | POA: Diagnosis not present

## 2013-09-30 DIAGNOSIS — Z124 Encounter for screening for malignant neoplasm of cervix: Secondary | ICD-10-CM | POA: Diagnosis not present

## 2013-11-02 ENCOUNTER — Other Ambulatory Visit: Payer: Self-pay | Admitting: Dermatology

## 2013-11-02 DIAGNOSIS — I781 Nevus, non-neoplastic: Secondary | ICD-10-CM | POA: Diagnosis not present

## 2013-11-02 DIAGNOSIS — M47812 Spondylosis without myelopathy or radiculopathy, cervical region: Secondary | ICD-10-CM | POA: Diagnosis not present

## 2013-11-02 DIAGNOSIS — L821 Other seborrheic keratosis: Secondary | ICD-10-CM | POA: Diagnosis not present

## 2013-11-02 DIAGNOSIS — M542 Cervicalgia: Secondary | ICD-10-CM | POA: Diagnosis not present

## 2013-11-02 DIAGNOSIS — D239 Other benign neoplasm of skin, unspecified: Secondary | ICD-10-CM | POA: Diagnosis not present

## 2013-11-02 DIAGNOSIS — D485 Neoplasm of uncertain behavior of skin: Secondary | ICD-10-CM | POA: Diagnosis not present

## 2013-11-23 DIAGNOSIS — M25519 Pain in unspecified shoulder: Secondary | ICD-10-CM | POA: Diagnosis not present

## 2013-12-09 DIAGNOSIS — M67919 Unspecified disorder of synovium and tendon, unspecified shoulder: Secondary | ICD-10-CM | POA: Diagnosis not present

## 2013-12-09 DIAGNOSIS — G8918 Other acute postprocedural pain: Secondary | ICD-10-CM | POA: Diagnosis not present

## 2013-12-09 DIAGNOSIS — M19019 Primary osteoarthritis, unspecified shoulder: Secondary | ICD-10-CM | POA: Diagnosis not present

## 2013-12-09 DIAGNOSIS — M24119 Other articular cartilage disorders, unspecified shoulder: Secondary | ICD-10-CM | POA: Diagnosis not present

## 2013-12-09 DIAGNOSIS — M75100 Unspecified rotator cuff tear or rupture of unspecified shoulder, not specified as traumatic: Secondary | ICD-10-CM | POA: Diagnosis not present

## 2013-12-09 DIAGNOSIS — IMO0002 Reserved for concepts with insufficient information to code with codable children: Secondary | ICD-10-CM | POA: Diagnosis not present

## 2013-12-09 DIAGNOSIS — M25819 Other specified joint disorders, unspecified shoulder: Secondary | ICD-10-CM | POA: Diagnosis not present

## 2013-12-09 DIAGNOSIS — M7530 Calcific tendinitis of unspecified shoulder: Secondary | ICD-10-CM | POA: Diagnosis not present

## 2013-12-11 DIAGNOSIS — M25511 Pain in right shoulder: Secondary | ICD-10-CM | POA: Diagnosis not present

## 2013-12-11 DIAGNOSIS — M25611 Stiffness of right shoulder, not elsewhere classified: Secondary | ICD-10-CM | POA: Diagnosis not present

## 2013-12-11 DIAGNOSIS — M6281 Muscle weakness (generalized): Secondary | ICD-10-CM | POA: Diagnosis not present

## 2013-12-14 DIAGNOSIS — M6281 Muscle weakness (generalized): Secondary | ICD-10-CM | POA: Diagnosis not present

## 2013-12-14 DIAGNOSIS — M25611 Stiffness of right shoulder, not elsewhere classified: Secondary | ICD-10-CM | POA: Diagnosis not present

## 2013-12-14 DIAGNOSIS — M25511 Pain in right shoulder: Secondary | ICD-10-CM | POA: Diagnosis not present

## 2013-12-16 DIAGNOSIS — M25511 Pain in right shoulder: Secondary | ICD-10-CM | POA: Diagnosis not present

## 2013-12-16 DIAGNOSIS — M25611 Stiffness of right shoulder, not elsewhere classified: Secondary | ICD-10-CM | POA: Diagnosis not present

## 2013-12-16 DIAGNOSIS — M6281 Muscle weakness (generalized): Secondary | ICD-10-CM | POA: Diagnosis not present

## 2013-12-18 DIAGNOSIS — M25611 Stiffness of right shoulder, not elsewhere classified: Secondary | ICD-10-CM | POA: Diagnosis not present

## 2013-12-18 DIAGNOSIS — M25511 Pain in right shoulder: Secondary | ICD-10-CM | POA: Diagnosis not present

## 2013-12-18 DIAGNOSIS — M6281 Muscle weakness (generalized): Secondary | ICD-10-CM | POA: Diagnosis not present

## 2013-12-21 DIAGNOSIS — M6281 Muscle weakness (generalized): Secondary | ICD-10-CM | POA: Diagnosis not present

## 2013-12-21 DIAGNOSIS — M25511 Pain in right shoulder: Secondary | ICD-10-CM | POA: Diagnosis not present

## 2013-12-21 DIAGNOSIS — M25611 Stiffness of right shoulder, not elsewhere classified: Secondary | ICD-10-CM | POA: Diagnosis not present

## 2013-12-23 DIAGNOSIS — M25611 Stiffness of right shoulder, not elsewhere classified: Secondary | ICD-10-CM | POA: Diagnosis not present

## 2013-12-23 DIAGNOSIS — M6281 Muscle weakness (generalized): Secondary | ICD-10-CM | POA: Diagnosis not present

## 2013-12-23 DIAGNOSIS — M25511 Pain in right shoulder: Secondary | ICD-10-CM | POA: Diagnosis not present

## 2013-12-25 DIAGNOSIS — M6281 Muscle weakness (generalized): Secondary | ICD-10-CM | POA: Diagnosis not present

## 2013-12-25 DIAGNOSIS — Z23 Encounter for immunization: Secondary | ICD-10-CM | POA: Diagnosis not present

## 2013-12-25 DIAGNOSIS — M25611 Stiffness of right shoulder, not elsewhere classified: Secondary | ICD-10-CM | POA: Diagnosis not present

## 2013-12-25 DIAGNOSIS — M25511 Pain in right shoulder: Secondary | ICD-10-CM | POA: Diagnosis not present

## 2013-12-28 DIAGNOSIS — M25611 Stiffness of right shoulder, not elsewhere classified: Secondary | ICD-10-CM | POA: Diagnosis not present

## 2013-12-28 DIAGNOSIS — M25511 Pain in right shoulder: Secondary | ICD-10-CM | POA: Diagnosis not present

## 2013-12-28 DIAGNOSIS — M6281 Muscle weakness (generalized): Secondary | ICD-10-CM | POA: Diagnosis not present

## 2013-12-30 DIAGNOSIS — M25611 Stiffness of right shoulder, not elsewhere classified: Secondary | ICD-10-CM | POA: Diagnosis not present

## 2013-12-30 DIAGNOSIS — M6281 Muscle weakness (generalized): Secondary | ICD-10-CM | POA: Diagnosis not present

## 2013-12-30 DIAGNOSIS — M25511 Pain in right shoulder: Secondary | ICD-10-CM | POA: Diagnosis not present

## 2014-01-01 DIAGNOSIS — M25511 Pain in right shoulder: Secondary | ICD-10-CM | POA: Diagnosis not present

## 2014-01-01 DIAGNOSIS — M6281 Muscle weakness (generalized): Secondary | ICD-10-CM | POA: Diagnosis not present

## 2014-01-01 DIAGNOSIS — M25611 Stiffness of right shoulder, not elsewhere classified: Secondary | ICD-10-CM | POA: Diagnosis not present

## 2014-01-05 DIAGNOSIS — M6281 Muscle weakness (generalized): Secondary | ICD-10-CM | POA: Diagnosis not present

## 2014-01-05 DIAGNOSIS — M25511 Pain in right shoulder: Secondary | ICD-10-CM | POA: Diagnosis not present

## 2014-01-05 DIAGNOSIS — M25611 Stiffness of right shoulder, not elsewhere classified: Secondary | ICD-10-CM | POA: Diagnosis not present

## 2014-01-07 ENCOUNTER — Other Ambulatory Visit: Payer: Self-pay | Admitting: Dermatology

## 2014-01-07 DIAGNOSIS — D225 Melanocytic nevi of trunk: Secondary | ICD-10-CM | POA: Diagnosis not present

## 2014-01-07 DIAGNOSIS — D485 Neoplasm of uncertain behavior of skin: Secondary | ICD-10-CM | POA: Diagnosis not present

## 2014-01-08 DIAGNOSIS — M6281 Muscle weakness (generalized): Secondary | ICD-10-CM | POA: Diagnosis not present

## 2014-01-08 DIAGNOSIS — M25611 Stiffness of right shoulder, not elsewhere classified: Secondary | ICD-10-CM | POA: Diagnosis not present

## 2014-01-08 DIAGNOSIS — M25511 Pain in right shoulder: Secondary | ICD-10-CM | POA: Diagnosis not present

## 2014-01-27 DIAGNOSIS — M19041 Primary osteoarthritis, right hand: Secondary | ICD-10-CM | POA: Diagnosis not present

## 2014-01-27 DIAGNOSIS — D2111 Benign neoplasm of connective and other soft tissue of right upper limb, including shoulder: Secondary | ICD-10-CM | POA: Diagnosis not present

## 2014-03-08 DIAGNOSIS — J069 Acute upper respiratory infection, unspecified: Secondary | ICD-10-CM | POA: Diagnosis not present

## 2014-07-20 DIAGNOSIS — J45909 Unspecified asthma, uncomplicated: Secondary | ICD-10-CM | POA: Diagnosis not present

## 2014-07-26 DIAGNOSIS — Z1231 Encounter for screening mammogram for malignant neoplasm of breast: Secondary | ICD-10-CM | POA: Diagnosis not present

## 2014-07-26 DIAGNOSIS — Z803 Family history of malignant neoplasm of breast: Secondary | ICD-10-CM | POA: Diagnosis not present

## 2014-07-28 DIAGNOSIS — J9801 Acute bronchospasm: Secondary | ICD-10-CM | POA: Diagnosis not present

## 2014-07-28 DIAGNOSIS — N6002 Solitary cyst of left breast: Secondary | ICD-10-CM | POA: Diagnosis not present

## 2014-07-28 DIAGNOSIS — J45909 Unspecified asthma, uncomplicated: Secondary | ICD-10-CM | POA: Diagnosis not present

## 2014-08-23 DIAGNOSIS — Z0000 Encounter for general adult medical examination without abnormal findings: Secondary | ICD-10-CM | POA: Diagnosis not present

## 2014-08-23 DIAGNOSIS — D649 Anemia, unspecified: Secondary | ICD-10-CM | POA: Diagnosis not present

## 2014-08-23 DIAGNOSIS — E780 Pure hypercholesterolemia: Secondary | ICD-10-CM | POA: Diagnosis not present

## 2014-08-23 DIAGNOSIS — Z791 Long term (current) use of non-steroidal anti-inflammatories (NSAID): Secondary | ICD-10-CM | POA: Diagnosis not present

## 2014-08-30 DIAGNOSIS — Z791 Long term (current) use of non-steroidal anti-inflammatories (NSAID): Secondary | ICD-10-CM | POA: Diagnosis not present

## 2014-08-30 DIAGNOSIS — E780 Pure hypercholesterolemia: Secondary | ICD-10-CM | POA: Diagnosis not present

## 2014-08-30 DIAGNOSIS — Z0000 Encounter for general adult medical examination without abnormal findings: Secondary | ICD-10-CM | POA: Diagnosis not present

## 2014-09-28 DIAGNOSIS — M7582 Other shoulder lesions, left shoulder: Secondary | ICD-10-CM | POA: Diagnosis not present

## 2014-10-18 DIAGNOSIS — N760 Acute vaginitis: Secondary | ICD-10-CM | POA: Diagnosis not present

## 2014-10-18 DIAGNOSIS — Z780 Asymptomatic menopausal state: Secondary | ICD-10-CM | POA: Diagnosis not present

## 2014-11-08 DIAGNOSIS — S46012A Strain of muscle(s) and tendon(s) of the rotator cuff of left shoulder, initial encounter: Secondary | ICD-10-CM | POA: Diagnosis not present

## 2014-11-08 DIAGNOSIS — S83241A Other tear of medial meniscus, current injury, right knee, initial encounter: Secondary | ICD-10-CM | POA: Diagnosis not present

## 2014-12-24 DIAGNOSIS — Z23 Encounter for immunization: Secondary | ICD-10-CM | POA: Diagnosis not present

## 2014-12-30 DIAGNOSIS — Z1211 Encounter for screening for malignant neoplasm of colon: Secondary | ICD-10-CM | POA: Diagnosis not present

## 2014-12-30 DIAGNOSIS — K580 Irritable bowel syndrome with diarrhea: Secondary | ICD-10-CM | POA: Diagnosis not present

## 2014-12-30 DIAGNOSIS — K5730 Diverticulosis of large intestine without perforation or abscess without bleeding: Secondary | ICD-10-CM | POA: Diagnosis not present

## 2014-12-30 DIAGNOSIS — Z800 Family history of malignant neoplasm of digestive organs: Secondary | ICD-10-CM | POA: Diagnosis not present

## 2014-12-30 DIAGNOSIS — R152 Fecal urgency: Secondary | ICD-10-CM | POA: Diagnosis not present

## 2014-12-31 DIAGNOSIS — M25561 Pain in right knee: Secondary | ICD-10-CM | POA: Diagnosis not present

## 2015-01-17 DIAGNOSIS — M25561 Pain in right knee: Secondary | ICD-10-CM | POA: Diagnosis not present

## 2015-01-17 DIAGNOSIS — S83411D Sprain of medial collateral ligament of right knee, subsequent encounter: Secondary | ICD-10-CM | POA: Diagnosis not present

## 2015-01-17 DIAGNOSIS — R262 Difficulty in walking, not elsewhere classified: Secondary | ICD-10-CM | POA: Diagnosis not present

## 2015-01-17 DIAGNOSIS — M222X1 Patellofemoral disorders, right knee: Secondary | ICD-10-CM | POA: Diagnosis not present

## 2015-01-20 DIAGNOSIS — M25561 Pain in right knee: Secondary | ICD-10-CM | POA: Diagnosis not present

## 2015-01-20 DIAGNOSIS — S83411D Sprain of medial collateral ligament of right knee, subsequent encounter: Secondary | ICD-10-CM | POA: Diagnosis not present

## 2015-01-20 DIAGNOSIS — R262 Difficulty in walking, not elsewhere classified: Secondary | ICD-10-CM | POA: Diagnosis not present

## 2015-01-20 DIAGNOSIS — M222X1 Patellofemoral disorders, right knee: Secondary | ICD-10-CM | POA: Diagnosis not present

## 2015-01-24 DIAGNOSIS — M222X1 Patellofemoral disorders, right knee: Secondary | ICD-10-CM | POA: Diagnosis not present

## 2015-01-24 DIAGNOSIS — M25561 Pain in right knee: Secondary | ICD-10-CM | POA: Diagnosis not present

## 2015-01-24 DIAGNOSIS — S83411D Sprain of medial collateral ligament of right knee, subsequent encounter: Secondary | ICD-10-CM | POA: Diagnosis not present

## 2015-01-24 DIAGNOSIS — R262 Difficulty in walking, not elsewhere classified: Secondary | ICD-10-CM | POA: Diagnosis not present

## 2015-01-27 DIAGNOSIS — S83411D Sprain of medial collateral ligament of right knee, subsequent encounter: Secondary | ICD-10-CM | POA: Diagnosis not present

## 2015-01-27 DIAGNOSIS — M25561 Pain in right knee: Secondary | ICD-10-CM | POA: Diagnosis not present

## 2015-01-27 DIAGNOSIS — M222X1 Patellofemoral disorders, right knee: Secondary | ICD-10-CM | POA: Diagnosis not present

## 2015-01-27 DIAGNOSIS — R262 Difficulty in walking, not elsewhere classified: Secondary | ICD-10-CM | POA: Diagnosis not present

## 2015-01-31 DIAGNOSIS — K5730 Diverticulosis of large intestine without perforation or abscess without bleeding: Secondary | ICD-10-CM | POA: Diagnosis not present

## 2015-01-31 DIAGNOSIS — Z1211 Encounter for screening for malignant neoplasm of colon: Secondary | ICD-10-CM | POA: Diagnosis not present

## 2015-01-31 DIAGNOSIS — D122 Benign neoplasm of ascending colon: Secondary | ICD-10-CM | POA: Diagnosis not present

## 2015-01-31 DIAGNOSIS — Z800 Family history of malignant neoplasm of digestive organs: Secondary | ICD-10-CM | POA: Diagnosis not present

## 2015-01-31 DIAGNOSIS — K635 Polyp of colon: Secondary | ICD-10-CM | POA: Diagnosis not present

## 2015-01-31 DIAGNOSIS — D120 Benign neoplasm of cecum: Secondary | ICD-10-CM | POA: Diagnosis not present

## 2015-02-01 DIAGNOSIS — R262 Difficulty in walking, not elsewhere classified: Secondary | ICD-10-CM | POA: Diagnosis not present

## 2015-02-01 DIAGNOSIS — M25561 Pain in right knee: Secondary | ICD-10-CM | POA: Diagnosis not present

## 2015-02-01 DIAGNOSIS — S83411D Sprain of medial collateral ligament of right knee, subsequent encounter: Secondary | ICD-10-CM | POA: Diagnosis not present

## 2015-02-01 DIAGNOSIS — M222X1 Patellofemoral disorders, right knee: Secondary | ICD-10-CM | POA: Diagnosis not present

## 2015-02-02 DIAGNOSIS — M25561 Pain in right knee: Secondary | ICD-10-CM | POA: Diagnosis not present

## 2015-02-02 DIAGNOSIS — R262 Difficulty in walking, not elsewhere classified: Secondary | ICD-10-CM | POA: Diagnosis not present

## 2015-02-02 DIAGNOSIS — S83411D Sprain of medial collateral ligament of right knee, subsequent encounter: Secondary | ICD-10-CM | POA: Diagnosis not present

## 2015-02-02 DIAGNOSIS — M222X1 Patellofemoral disorders, right knee: Secondary | ICD-10-CM | POA: Diagnosis not present

## 2015-02-07 DIAGNOSIS — M222X1 Patellofemoral disorders, right knee: Secondary | ICD-10-CM | POA: Diagnosis not present

## 2015-02-07 DIAGNOSIS — R262 Difficulty in walking, not elsewhere classified: Secondary | ICD-10-CM | POA: Diagnosis not present

## 2015-02-07 DIAGNOSIS — M25561 Pain in right knee: Secondary | ICD-10-CM | POA: Diagnosis not present

## 2015-02-07 DIAGNOSIS — S83411D Sprain of medial collateral ligament of right knee, subsequent encounter: Secondary | ICD-10-CM | POA: Diagnosis not present

## 2015-02-10 DIAGNOSIS — M25561 Pain in right knee: Secondary | ICD-10-CM | POA: Diagnosis not present

## 2015-02-10 DIAGNOSIS — R262 Difficulty in walking, not elsewhere classified: Secondary | ICD-10-CM | POA: Diagnosis not present

## 2015-02-10 DIAGNOSIS — S83411D Sprain of medial collateral ligament of right knee, subsequent encounter: Secondary | ICD-10-CM | POA: Diagnosis not present

## 2015-02-10 DIAGNOSIS — M222X1 Patellofemoral disorders, right knee: Secondary | ICD-10-CM | POA: Diagnosis not present

## 2015-02-14 DIAGNOSIS — M222X1 Patellofemoral disorders, right knee: Secondary | ICD-10-CM | POA: Diagnosis not present

## 2015-03-09 DIAGNOSIS — Z86018 Personal history of other benign neoplasm: Secondary | ICD-10-CM | POA: Diagnosis not present

## 2015-03-09 DIAGNOSIS — L821 Other seborrheic keratosis: Secondary | ICD-10-CM | POA: Diagnosis not present

## 2015-03-09 DIAGNOSIS — D1800 Hemangioma unspecified site: Secondary | ICD-10-CM | POA: Diagnosis not present

## 2015-03-09 DIAGNOSIS — Z23 Encounter for immunization: Secondary | ICD-10-CM | POA: Diagnosis not present

## 2015-03-09 DIAGNOSIS — D225 Melanocytic nevi of trunk: Secondary | ICD-10-CM | POA: Diagnosis not present

## 2015-07-20 DIAGNOSIS — H1045 Other chronic allergic conjunctivitis: Secondary | ICD-10-CM | POA: Diagnosis not present

## 2015-07-20 DIAGNOSIS — J3089 Other allergic rhinitis: Secondary | ICD-10-CM | POA: Diagnosis not present

## 2015-08-09 DIAGNOSIS — Z853 Personal history of malignant neoplasm of breast: Secondary | ICD-10-CM | POA: Diagnosis not present

## 2015-08-09 DIAGNOSIS — N6002 Solitary cyst of left breast: Secondary | ICD-10-CM | POA: Diagnosis not present

## 2015-08-09 DIAGNOSIS — R922 Inconclusive mammogram: Secondary | ICD-10-CM | POA: Diagnosis not present

## 2015-08-29 DIAGNOSIS — N390 Urinary tract infection, site not specified: Secondary | ICD-10-CM | POA: Diagnosis not present

## 2015-08-29 DIAGNOSIS — R635 Abnormal weight gain: Secondary | ICD-10-CM | POA: Diagnosis not present

## 2015-08-29 DIAGNOSIS — Z0000 Encounter for general adult medical examination without abnormal findings: Secondary | ICD-10-CM | POA: Diagnosis not present

## 2015-08-29 DIAGNOSIS — Z23 Encounter for immunization: Secondary | ICD-10-CM | POA: Diagnosis not present

## 2015-08-29 DIAGNOSIS — E7800 Pure hypercholesterolemia, unspecified: Secondary | ICD-10-CM | POA: Diagnosis not present

## 2015-09-02 DIAGNOSIS — J309 Allergic rhinitis, unspecified: Secondary | ICD-10-CM | POA: Diagnosis not present

## 2015-09-02 DIAGNOSIS — E7800 Pure hypercholesterolemia, unspecified: Secondary | ICD-10-CM | POA: Diagnosis not present

## 2015-09-26 DIAGNOSIS — R0982 Postnasal drip: Secondary | ICD-10-CM | POA: Diagnosis not present

## 2015-09-26 DIAGNOSIS — H9190 Unspecified hearing loss, unspecified ear: Secondary | ICD-10-CM | POA: Diagnosis not present

## 2015-10-03 DIAGNOSIS — J069 Acute upper respiratory infection, unspecified: Secondary | ICD-10-CM | POA: Diagnosis not present

## 2015-10-03 DIAGNOSIS — R05 Cough: Secondary | ICD-10-CM | POA: Diagnosis not present

## 2015-10-24 DIAGNOSIS — H903 Sensorineural hearing loss, bilateral: Secondary | ICD-10-CM | POA: Diagnosis not present

## 2015-11-04 DIAGNOSIS — Z79890 Hormone replacement therapy (postmenopausal): Secondary | ICD-10-CM | POA: Diagnosis not present

## 2015-11-04 DIAGNOSIS — N760 Acute vaginitis: Secondary | ICD-10-CM | POA: Diagnosis not present

## 2015-11-04 DIAGNOSIS — Z124 Encounter for screening for malignant neoplasm of cervix: Secondary | ICD-10-CM | POA: Diagnosis not present

## 2015-11-07 DIAGNOSIS — Z411 Encounter for cosmetic surgery: Secondary | ICD-10-CM | POA: Diagnosis not present

## 2015-11-07 DIAGNOSIS — L821 Other seborrheic keratosis: Secondary | ICD-10-CM | POA: Diagnosis not present

## 2015-11-17 DIAGNOSIS — M7582 Other shoulder lesions, left shoulder: Secondary | ICD-10-CM | POA: Diagnosis not present

## 2015-11-17 DIAGNOSIS — M542 Cervicalgia: Secondary | ICD-10-CM | POA: Diagnosis not present

## 2015-11-25 DIAGNOSIS — Z23 Encounter for immunization: Secondary | ICD-10-CM | POA: Diagnosis not present

## 2015-12-12 DIAGNOSIS — M25512 Pain in left shoulder: Secondary | ICD-10-CM | POA: Diagnosis not present

## 2016-01-09 DIAGNOSIS — M542 Cervicalgia: Secondary | ICD-10-CM | POA: Diagnosis not present

## 2016-02-13 DIAGNOSIS — M542 Cervicalgia: Secondary | ICD-10-CM | POA: Diagnosis not present

## 2016-02-13 DIAGNOSIS — H2513 Age-related nuclear cataract, bilateral: Secondary | ICD-10-CM | POA: Diagnosis not present

## 2016-02-13 DIAGNOSIS — H5203 Hypermetropia, bilateral: Secondary | ICD-10-CM | POA: Diagnosis not present

## 2016-02-14 DIAGNOSIS — N952 Postmenopausal atrophic vaginitis: Secondary | ICD-10-CM | POA: Diagnosis not present

## 2016-02-14 DIAGNOSIS — L900 Lichen sclerosus et atrophicus: Secondary | ICD-10-CM | POA: Diagnosis not present

## 2016-02-15 DIAGNOSIS — M5022 Other cervical disc displacement, mid-cervical region, unspecified level: Secondary | ICD-10-CM | POA: Diagnosis not present

## 2016-02-15 DIAGNOSIS — M50223 Other cervical disc displacement at C6-C7 level: Secondary | ICD-10-CM | POA: Diagnosis not present

## 2016-02-15 DIAGNOSIS — M542 Cervicalgia: Secondary | ICD-10-CM | POA: Diagnosis not present

## 2016-02-20 DIAGNOSIS — M542 Cervicalgia: Secondary | ICD-10-CM | POA: Diagnosis not present

## 2016-03-08 DIAGNOSIS — M542 Cervicalgia: Secondary | ICD-10-CM | POA: Diagnosis not present

## 2016-03-09 DIAGNOSIS — M47892 Other spondylosis, cervical region: Secondary | ICD-10-CM | POA: Diagnosis not present

## 2016-03-09 DIAGNOSIS — S43422D Sprain of left rotator cuff capsule, subsequent encounter: Secondary | ICD-10-CM | POA: Diagnosis not present

## 2016-03-09 DIAGNOSIS — M25512 Pain in left shoulder: Secondary | ICD-10-CM | POA: Diagnosis not present

## 2016-03-14 DIAGNOSIS — S43422D Sprain of left rotator cuff capsule, subsequent encounter: Secondary | ICD-10-CM | POA: Diagnosis not present

## 2016-03-14 DIAGNOSIS — M25512 Pain in left shoulder: Secondary | ICD-10-CM | POA: Diagnosis not present

## 2016-03-14 DIAGNOSIS — M47892 Other spondylosis, cervical region: Secondary | ICD-10-CM | POA: Diagnosis not present

## 2016-03-19 DIAGNOSIS — S43422D Sprain of left rotator cuff capsule, subsequent encounter: Secondary | ICD-10-CM | POA: Diagnosis not present

## 2016-03-19 DIAGNOSIS — M47892 Other spondylosis, cervical region: Secondary | ICD-10-CM | POA: Diagnosis not present

## 2016-03-19 DIAGNOSIS — M25512 Pain in left shoulder: Secondary | ICD-10-CM | POA: Diagnosis not present

## 2016-03-20 DIAGNOSIS — D225 Melanocytic nevi of trunk: Secondary | ICD-10-CM | POA: Diagnosis not present

## 2016-03-20 DIAGNOSIS — L814 Other melanin hyperpigmentation: Secondary | ICD-10-CM | POA: Diagnosis not present

## 2016-03-20 DIAGNOSIS — D1801 Hemangioma of skin and subcutaneous tissue: Secondary | ICD-10-CM | POA: Diagnosis not present

## 2016-03-20 DIAGNOSIS — L720 Epidermal cyst: Secondary | ICD-10-CM | POA: Diagnosis not present

## 2016-03-20 DIAGNOSIS — Z86018 Personal history of other benign neoplasm: Secondary | ICD-10-CM | POA: Diagnosis not present

## 2016-03-22 DIAGNOSIS — S43422D Sprain of left rotator cuff capsule, subsequent encounter: Secondary | ICD-10-CM | POA: Diagnosis not present

## 2016-03-22 DIAGNOSIS — M47892 Other spondylosis, cervical region: Secondary | ICD-10-CM | POA: Diagnosis not present

## 2016-03-22 DIAGNOSIS — M25512 Pain in left shoulder: Secondary | ICD-10-CM | POA: Diagnosis not present

## 2016-03-26 DIAGNOSIS — M47892 Other spondylosis, cervical region: Secondary | ICD-10-CM | POA: Diagnosis not present

## 2016-03-26 DIAGNOSIS — M25512 Pain in left shoulder: Secondary | ICD-10-CM | POA: Diagnosis not present

## 2016-03-26 DIAGNOSIS — S43422D Sprain of left rotator cuff capsule, subsequent encounter: Secondary | ICD-10-CM | POA: Diagnosis not present

## 2016-04-02 DIAGNOSIS — S43422D Sprain of left rotator cuff capsule, subsequent encounter: Secondary | ICD-10-CM | POA: Diagnosis not present

## 2016-04-02 DIAGNOSIS — M25512 Pain in left shoulder: Secondary | ICD-10-CM | POA: Diagnosis not present

## 2016-04-02 DIAGNOSIS — M47892 Other spondylosis, cervical region: Secondary | ICD-10-CM | POA: Diagnosis not present

## 2016-04-05 DIAGNOSIS — M25512 Pain in left shoulder: Secondary | ICD-10-CM | POA: Diagnosis not present

## 2016-04-05 DIAGNOSIS — M47892 Other spondylosis, cervical region: Secondary | ICD-10-CM | POA: Diagnosis not present

## 2016-04-05 DIAGNOSIS — S43422D Sprain of left rotator cuff capsule, subsequent encounter: Secondary | ICD-10-CM | POA: Diagnosis not present

## 2016-04-10 DIAGNOSIS — B369 Superficial mycosis, unspecified: Secondary | ICD-10-CM | POA: Diagnosis not present

## 2016-04-10 DIAGNOSIS — L900 Lichen sclerosus et atrophicus: Secondary | ICD-10-CM | POA: Diagnosis not present

## 2016-04-18 DIAGNOSIS — J3089 Other allergic rhinitis: Secondary | ICD-10-CM | POA: Diagnosis not present

## 2016-04-18 DIAGNOSIS — H1045 Other chronic allergic conjunctivitis: Secondary | ICD-10-CM | POA: Diagnosis not present

## 2016-05-04 DIAGNOSIS — L900 Lichen sclerosus et atrophicus: Secondary | ICD-10-CM | POA: Diagnosis not present

## 2016-05-04 DIAGNOSIS — N952 Postmenopausal atrophic vaginitis: Secondary | ICD-10-CM | POA: Diagnosis not present

## 2016-06-11 DIAGNOSIS — M545 Low back pain: Secondary | ICD-10-CM | POA: Diagnosis not present

## 2016-06-11 DIAGNOSIS — S76011A Strain of muscle, fascia and tendon of right hip, initial encounter: Secondary | ICD-10-CM | POA: Diagnosis not present

## 2016-06-26 DIAGNOSIS — M25521 Pain in right elbow: Secondary | ICD-10-CM | POA: Diagnosis not present

## 2016-06-26 DIAGNOSIS — S134XXD Sprain of ligaments of cervical spine, subsequent encounter: Secondary | ICD-10-CM | POA: Diagnosis not present

## 2016-07-10 DIAGNOSIS — N952 Postmenopausal atrophic vaginitis: Secondary | ICD-10-CM | POA: Diagnosis not present

## 2016-07-10 DIAGNOSIS — L900 Lichen sclerosus et atrophicus: Secondary | ICD-10-CM | POA: Diagnosis not present

## 2016-07-12 DIAGNOSIS — M25512 Pain in left shoulder: Secondary | ICD-10-CM | POA: Diagnosis not present

## 2016-08-07 DIAGNOSIS — N6002 Solitary cyst of left breast: Secondary | ICD-10-CM | POA: Diagnosis not present

## 2016-08-22 ENCOUNTER — Ambulatory Visit: Payer: Medicare Other | Admitting: Sports Medicine

## 2016-08-28 ENCOUNTER — Ambulatory Visit (INDEPENDENT_AMBULATORY_CARE_PROVIDER_SITE_OTHER): Payer: Medicare Other | Admitting: Sports Medicine

## 2016-08-28 VITALS — BP 120/70 | Ht 65.0 in | Wt 163.8 lb

## 2016-08-28 DIAGNOSIS — M722 Plantar fascial fibromatosis: Secondary | ICD-10-CM | POA: Diagnosis not present

## 2016-08-28 NOTE — Patient Instructions (Signed)
Thank you for coming in!  Your symptoms are consistent with plantar fasciitis  - Take meloxicam for 5-7 days to decrease inflammation, then as needed - do stretching exercises as much as possible - ice baths to the heel two to three times a day  - get new orthotics and shoes - follow up in 4 weeks

## 2016-08-28 NOTE — Progress Notes (Signed)
   Subjective:    Patient ID: April Simmons, female    DOB: 10-14-1945, 71 y.o.   MRN: [id number]  HPI  Ms. Riesen is a 71 yo female who presents with right heel pain for 6 weeks that has been progressively worsening. When the pain is at its worst, she also has radiation of the pain to the achilles tendon. Worse with prolonged standing, ambulation, and on treadmill. Reports she had plantar fasciitis of her left foot which did not resolve with steroid injection. PT helped some no the left but attributes grape seed extract to relieve her symptoms. She has been on this when her symptoms on her right heel started. She has been using orthotics for a long time and she makes sure to get new shoes and orthotics every year. She has not been taking OTC pain medications.  Review of Systems As noted above    Objective:   Physical Exam Gen: NAD, pleasant Right Foot: No visible erythema or swelling. Tenderness to palpation of the medial calcaneus. Heel squeeze with minimal pain  Limited US of the Right Heel:  Plantar fascia with signs of inflammation. Thickness 0.56 cm  Small reactive heel spur also noted.     Assessment & Plan:  Plantar Fasciitis: Symptoms and ultrasound consistent with plantar fasciitis.  - Patient has Meloxicam at home. Recommended Meloxicam for 5-7 days then PRN  - discussed stretching exercises  - ice bath for the heel BID-TID  - obtain now orthotics and shoes  - follow up in 4 weeks.   Smiley Houseman, MD PGY 2 Family Medicine   Patient seen and evaluated with the resident. I agree with the above plan of care. MSK ultrasound of her right heel shows a thickened plantar fascia and diffuse hypoechoic changes consistent with edema here. Patient will proceed with treatment as above and follow-up in 4 weeks. If symptoms persist or worsen then I'll consider further diagnostic imaging. Patient does have some mild pain with heel squeeze so we will need to rule out a calcaneal  stress fracture at follow-up if her symptoms do not start to improve.

## 2016-09-10 DIAGNOSIS — Z0000 Encounter for general adult medical examination without abnormal findings: Secondary | ICD-10-CM | POA: Diagnosis not present

## 2016-10-01 ENCOUNTER — Ambulatory Visit (INDEPENDENT_AMBULATORY_CARE_PROVIDER_SITE_OTHER): Payer: Medicare Other | Admitting: Sports Medicine

## 2016-10-01 ENCOUNTER — Encounter: Payer: Self-pay | Admitting: Sports Medicine

## 2016-10-01 VITALS — BP 130/78 | Ht 65.0 in | Wt 164.6 lb

## 2016-10-01 DIAGNOSIS — M722 Plantar fascial fibromatosis: Secondary | ICD-10-CM

## 2016-10-01 DIAGNOSIS — Z0000 Encounter for general adult medical examination without abnormal findings: Secondary | ICD-10-CM | POA: Diagnosis not present

## 2016-10-01 DIAGNOSIS — E7800 Pure hypercholesterolemia, unspecified: Secondary | ICD-10-CM | POA: Diagnosis not present

## 2016-10-01 DIAGNOSIS — N390 Urinary tract infection, site not specified: Secondary | ICD-10-CM | POA: Diagnosis not present

## 2016-10-01 DIAGNOSIS — E559 Vitamin D deficiency, unspecified: Secondary | ICD-10-CM | POA: Diagnosis not present

## 2016-10-01 NOTE — Progress Notes (Signed)
Sports Medicine Clinic Note  Subjective: April Simmons is a 71 yo female following up for right plantar fasciitis and chronic left foot pain.  Right Plantar Fasciitis -Seen 5 weeks ago for initial visit, significant interval improvement -Took 2-3 weeks of meloxicam -Is currently icing and stretching 2-4x/day -She bought new shoes and orthotics -She is using the arch straps bilaterally and states these work very well -No interval injury   Left Foot Pain -Chronic for 10 years without interval injury -Pain is achy and on mid arch of left foot -Unsure if it is plantar fasciitis -Has had prior injections without improvement -Has not been stretching, icing this side -No numbness or tingling  Objective: BP 130/78   Ht 5\' 5"  (1.651 m)   Wt 164 lb 9.6 oz (74.7 kg)   BMI 27.39 kg/m   General: comfortable, no acute distress  Right Foot Inspection: No obvious swelling, ecchymosis. Mild pes cavus. Palpation: Mildly TTP at distal calcaneus near plantar fascia insertion ROM: Ankle ROM intact throughout Strength: 5/5 throughout RLE Stability: Intact Special Tests: anterior drawer negative, heel squeeze negative, thompson negative Neuro: neurvascularly intact  Left Foot Inspection: No obvious swelling, ecchymosis. Mild pes cavus. Palpation: Mildly TTP at medial plantar fascia ROM: Ankle ROM intact throughout Strength: 5/5 throughout LLE Stability: Intact Special Tests: anterior drawer negative, heel squeeze negative, thompson negative Neuro: neurvascularly intact  Ultrasound: Left Foot: mild thickening of plantar fascia with mild edema, no acute deformities  Assessment/Plan:  Right Plantar Fasciitis: Significant interval clinical improvement. Has new shoes, orthotics, and arch strap.  -Continue stretching, icing 2-3x/daily -Continue using arch strap with activity -Cleared for gradually returning to walking on treadmill -Follow-up PRN  Left Plantar Fasciitis: H/o and exam c/w  mild plantar fasciitis. Ultrasound with only borderline plantar fascia thickening.  -Stretch and ice 2-3x/daily -Continue using arch strap with activity -Follow-up PRN

## 2016-10-08 DIAGNOSIS — Z803 Family history of malignant neoplasm of breast: Secondary | ICD-10-CM | POA: Diagnosis not present

## 2016-10-08 DIAGNOSIS — E7800 Pure hypercholesterolemia, unspecified: Secondary | ICD-10-CM | POA: Diagnosis not present

## 2016-10-08 DIAGNOSIS — Z23 Encounter for immunization: Secondary | ICD-10-CM | POA: Diagnosis not present

## 2016-10-08 DIAGNOSIS — Z79890 Hormone replacement therapy (postmenopausal): Secondary | ICD-10-CM | POA: Diagnosis not present

## 2016-10-08 DIAGNOSIS — J309 Allergic rhinitis, unspecified: Secondary | ICD-10-CM | POA: Diagnosis not present

## 2016-11-06 DIAGNOSIS — Z79890 Hormone replacement therapy (postmenopausal): Secondary | ICD-10-CM | POA: Diagnosis not present

## 2016-11-06 DIAGNOSIS — N898 Other specified noninflammatory disorders of vagina: Secondary | ICD-10-CM | POA: Diagnosis not present

## 2016-11-06 DIAGNOSIS — N951 Menopausal and female climacteric states: Secondary | ICD-10-CM | POA: Diagnosis not present

## 2016-11-06 DIAGNOSIS — Z124 Encounter for screening for malignant neoplasm of cervix: Secondary | ICD-10-CM | POA: Diagnosis not present

## 2016-12-13 DIAGNOSIS — M25512 Pain in left shoulder: Secondary | ICD-10-CM | POA: Diagnosis not present

## 2016-12-13 DIAGNOSIS — S134XXD Sprain of ligaments of cervical spine, subsequent encounter: Secondary | ICD-10-CM | POA: Diagnosis not present

## 2016-12-21 DIAGNOSIS — Z23 Encounter for immunization: Secondary | ICD-10-CM | POA: Diagnosis not present

## 2016-12-26 DIAGNOSIS — J3089 Other allergic rhinitis: Secondary | ICD-10-CM | POA: Diagnosis not present

## 2016-12-26 DIAGNOSIS — H1045 Other chronic allergic conjunctivitis: Secondary | ICD-10-CM | POA: Diagnosis not present

## 2017-01-01 DIAGNOSIS — L900 Lichen sclerosus et atrophicus: Secondary | ICD-10-CM | POA: Diagnosis not present

## 2017-01-01 DIAGNOSIS — N952 Postmenopausal atrophic vaginitis: Secondary | ICD-10-CM | POA: Diagnosis not present

## 2017-02-05 DIAGNOSIS — L900 Lichen sclerosus et atrophicus: Secondary | ICD-10-CM | POA: Diagnosis not present

## 2017-02-05 DIAGNOSIS — N760 Acute vaginitis: Secondary | ICD-10-CM | POA: Diagnosis not present

## 2017-02-07 DIAGNOSIS — M25512 Pain in left shoulder: Secondary | ICD-10-CM | POA: Diagnosis not present

## 2017-02-11 DIAGNOSIS — R079 Chest pain, unspecified: Secondary | ICD-10-CM | POA: Diagnosis not present

## 2017-02-12 DIAGNOSIS — H5203 Hypermetropia, bilateral: Secondary | ICD-10-CM | POA: Diagnosis not present

## 2017-02-12 DIAGNOSIS — H2513 Age-related nuclear cataract, bilateral: Secondary | ICD-10-CM | POA: Diagnosis not present

## 2017-02-12 DIAGNOSIS — H52203 Unspecified astigmatism, bilateral: Secondary | ICD-10-CM | POA: Diagnosis not present

## 2017-02-21 ENCOUNTER — Other Ambulatory Visit: Payer: Self-pay | Admitting: Orthopedic Surgery

## 2017-02-21 DIAGNOSIS — M898X1 Other specified disorders of bone, shoulder: Secondary | ICD-10-CM

## 2017-03-07 ENCOUNTER — Ambulatory Visit
Admission: RE | Admit: 2017-03-07 | Discharge: 2017-03-07 | Disposition: A | Discharge status: Home / Self Care | Payer: Medicare Other | Source: Ambulatory Visit | Attending: Orthopedic Surgery | Admitting: Orthopedic Surgery

## 2017-03-07 DIAGNOSIS — M19012 Primary osteoarthritis, left shoulder: Secondary | ICD-10-CM | POA: Diagnosis not present

## 2017-03-07 DIAGNOSIS — M898X1 Other specified disorders of bone, shoulder: Secondary | ICD-10-CM

## 2017-03-07 MED ORDER — METHYLPREDNISOLONE ACETATE 40 MG/ML INJ SUSP (RADIOLOG
120.0000 mg | Freq: Once | INTRAMUSCULAR | Status: AC
  Administered 2017-03-07: 120 mg via INTRA_ARTICULAR

## 2017-03-07 MED ORDER — IOPAMIDOL (ISOVUE-M 200) INJECTION 41%
1.0000 mL | Freq: Once | INTRAMUSCULAR | Status: AC
  Administered 2017-03-07: 1 mL via INTRA_ARTICULAR

## 2017-03-21 DIAGNOSIS — R238 Other skin changes: Secondary | ICD-10-CM | POA: Diagnosis not present

## 2017-03-21 DIAGNOSIS — Z86018 Personal history of other benign neoplasm: Secondary | ICD-10-CM | POA: Diagnosis not present

## 2017-03-21 DIAGNOSIS — D1801 Hemangioma of skin and subcutaneous tissue: Secondary | ICD-10-CM | POA: Diagnosis not present

## 2017-03-21 DIAGNOSIS — Z23 Encounter for immunization: Secondary | ICD-10-CM | POA: Diagnosis not present

## 2017-03-21 DIAGNOSIS — D225 Melanocytic nevi of trunk: Secondary | ICD-10-CM | POA: Diagnosis not present

## 2017-03-21 DIAGNOSIS — L814 Other melanin hyperpigmentation: Secondary | ICD-10-CM | POA: Diagnosis not present

## 2017-03-21 DIAGNOSIS — L821 Other seborrheic keratosis: Secondary | ICD-10-CM | POA: Diagnosis not present

## 2017-03-21 DIAGNOSIS — D2230 Melanocytic nevi of unspecified part of face: Secondary | ICD-10-CM | POA: Diagnosis not present

## 2017-04-24 DIAGNOSIS — H1045 Other chronic allergic conjunctivitis: Secondary | ICD-10-CM | POA: Diagnosis not present

## 2017-04-24 DIAGNOSIS — J3089 Other allergic rhinitis: Secondary | ICD-10-CM | POA: Diagnosis not present

## 2017-04-29 DIAGNOSIS — M542 Cervicalgia: Secondary | ICD-10-CM | POA: Diagnosis not present

## 2017-04-29 DIAGNOSIS — M7582 Other shoulder lesions, left shoulder: Secondary | ICD-10-CM | POA: Diagnosis not present

## 2017-04-29 DIAGNOSIS — S2242XA Multiple fractures of ribs, left side, initial encounter for closed fracture: Secondary | ICD-10-CM | POA: Diagnosis not present

## 2017-05-21 DIAGNOSIS — M542 Cervicalgia: Secondary | ICD-10-CM | POA: Diagnosis not present

## 2017-05-28 DIAGNOSIS — M7582 Other shoulder lesions, left shoulder: Secondary | ICD-10-CM | POA: Diagnosis not present

## 2017-06-26 ENCOUNTER — Ambulatory Visit (INDEPENDENT_AMBULATORY_CARE_PROVIDER_SITE_OTHER): Payer: Medicare Other | Admitting: Podiatry

## 2017-06-26 ENCOUNTER — Encounter: Payer: Self-pay | Admitting: Podiatry

## 2017-06-26 DIAGNOSIS — L600 Ingrowing nail: Secondary | ICD-10-CM | POA: Diagnosis not present

## 2017-06-26 MED ORDER — DOXYCYCLINE HYCLATE 100 MG PO TABS: 100.0000 mg | ORAL_TABLET | Freq: Two times a day (BID) | ORAL | 0 refills | Status: DC

## 2017-06-26 NOTE — Patient Instructions (Signed)

## 2017-06-28 DIAGNOSIS — N760 Acute vaginitis: Secondary | ICD-10-CM | POA: Diagnosis not present

## 2017-06-28 DIAGNOSIS — L900 Lichen sclerosus et atrophicus: Secondary | ICD-10-CM | POA: Diagnosis not present

## 2017-06-28 DIAGNOSIS — N952 Postmenopausal atrophic vaginitis: Secondary | ICD-10-CM | POA: Diagnosis not present

## 2017-07-02 NOTE — Progress Notes (Signed)
   Subjective: Patient presents today for evaluation of pain to the lateral border of the left great toenail that began yesterday after having a pedicure. She reports associated redness and swelling. Patient is concerned for possible ingrown nail. Applying pressure to the area increases the pain. She has not done anything to treat the symptoms. Patient presents today for further treatment and evaluation.  Past Medical History:  Diagnosis Date  . Arthritis   . Multiple environmental allergies     Objective:  General: Well developed, nourished, in no acute distress, alert and oriented x3   Dermatology: Skin is warm, dry and supple bilateral. Lateral border of the left great toe appears to be erythematous with evidence of an ingrowing nail. Pain on palpation noted to the border of the nail fold. The remaining nails appear unremarkable at this time. There are no open sores, lesions.  Vascular: Dorsalis Pedis artery and Posterior Tibial artery pedal pulses palpable. No lower extremity edema noted.   Neruologic: Grossly intact via light touch bilateral.  Musculoskeletal: Muscular strength within normal limits in all groups bilateral. Normal range of motion noted to all pedal and ankle joints.   Assesement: #1 Paronychia with ingrowing nail lateral border left great toe #2 Pain in toe #3 Incurvated nail  Plan of Care:  1. Patient evaluated.  2. Discussed treatment alternatives and plan of care. Explained nail avulsion procedure and post procedure course to patient. 3. Patient opted for permanent partial nail avulsion.  4. Prior to procedure, local anesthesia infiltration utilized using 3 ml of a 50:50 mixture of 2% plain lidocaine and 0.5% plain marcaine in a normal hallux block fashion and a betadine prep performed.  5. Partial permanent nail avulsion with chemical matrixectomy performed using [id number] applications of phenol followed by alcohol flush.  6. Light dressing applied. 7.  Prescription for Doxycycline #20 provided to patient.  8. Return to clinic in 2 weeks.   Edrick Kins, DPM [facility]  Dr. Edrick Kins, [address]                                        [place], [place] [address]                Office [phone]  Fax [phone]

## 2017-07-10 ENCOUNTER — Ambulatory Visit (INDEPENDENT_AMBULATORY_CARE_PROVIDER_SITE_OTHER): Payer: Medicare Other | Admitting: Podiatry

## 2017-07-10 DIAGNOSIS — L600 Ingrowing nail: Secondary | ICD-10-CM | POA: Diagnosis not present

## 2017-07-15 NOTE — Progress Notes (Signed)
   Subjective: Patient presents today 2 weeks post ingrown nail permanent nail avulsion procedure of the lateral border of the left great toe. Patient states that the toe and nail fold is feeling much better. Patient is here for further evaluation and treatment.   Past Medical History:  Diagnosis Date  . Arthritis   . Multiple environmental allergies     Objective: Skin is warm, dry and supple. Nail and respective nail fold appears to be healing appropriately. Open wound to the associated nail fold with a granular wound base and moderate amount of fibrotic tissue. Minimal drainage noted. Mild erythema around the periungual region likely due to phenol chemical matricectomy.  Assessment: #1 postop permanent partial nail avulsion lateral border left great toe #2 open wound periungual nail fold of respective digit.   Plan of care: #1 patient was evaluated  #2 debridement of open wound was performed to the periungual border of the respective toe using a currette. Antibiotic ointment and Band-Aid was applied. #3 patient is to return to clinic on a PRN basis.   Edrick Kins, DPM [facility]  Dr. Edrick Kins, [address]                                        [place],  [address]                Office [phone]  Fax [phone]

## 2017-08-08 DIAGNOSIS — Z1231 Encounter for screening mammogram for malignant neoplasm of breast: Secondary | ICD-10-CM | POA: Diagnosis not present

## 2017-08-08 DIAGNOSIS — Z780 Asymptomatic menopausal state: Secondary | ICD-10-CM | POA: Diagnosis not present

## 2017-08-08 DIAGNOSIS — Z803 Family history of malignant neoplasm of breast: Secondary | ICD-10-CM | POA: Diagnosis not present

## 2017-09-08 ENCOUNTER — Encounter (HOSPITAL_COMMUNITY): Payer: Self-pay

## 2017-09-08 ENCOUNTER — Emergency Department (HOSPITAL_COMMUNITY)
Admission: EM | Admit: 2017-09-08 | Discharge: 2017-09-08 | Disposition: A | Discharge status: Home / Self Care | Payer: Medicare Other | Attending: Emergency Medicine | Admitting: Emergency Medicine

## 2017-09-08 ENCOUNTER — Emergency Department (HOSPITAL_COMMUNITY): Payer: Medicare Other

## 2017-09-08 ENCOUNTER — Telehealth: Payer: Self-pay | Admitting: Physician Assistant

## 2017-09-08 DIAGNOSIS — R638 Other symptoms and signs concerning food and fluid intake: Secondary | ICD-10-CM | POA: Insufficient documentation

## 2017-09-08 DIAGNOSIS — I10 Essential (primary) hypertension: Secondary | ICD-10-CM | POA: Insufficient documentation

## 2017-09-08 DIAGNOSIS — Z79899 Other long term (current) drug therapy: Secondary | ICD-10-CM | POA: Diagnosis not present

## 2017-09-08 DIAGNOSIS — K529 Noninfective gastroenteritis and colitis, unspecified: Secondary | ICD-10-CM | POA: Insufficient documentation

## 2017-09-08 DIAGNOSIS — R1030 Lower abdominal pain, unspecified: Secondary | ICD-10-CM | POA: Insufficient documentation

## 2017-09-08 DIAGNOSIS — K8020 Calculus of gallbladder without cholecystitis without obstruction: Secondary | ICD-10-CM | POA: Diagnosis not present

## 2017-09-08 DIAGNOSIS — R6883 Chills (without fever): Secondary | ICD-10-CM | POA: Insufficient documentation

## 2017-09-08 DIAGNOSIS — K5730 Diverticulosis of large intestine without perforation or abscess without bleeding: Secondary | ICD-10-CM | POA: Diagnosis not present

## 2017-09-08 DIAGNOSIS — Z87891 Personal history of nicotine dependence: Secondary | ICD-10-CM | POA: Insufficient documentation

## 2017-09-08 DIAGNOSIS — R109 Unspecified abdominal pain: Secondary | ICD-10-CM | POA: Diagnosis present

## 2017-09-08 LAB — COMPREHENSIVE METABOLIC PANEL
ALT: 26 U/L (ref 0–44)
AST: 29 U/L (ref 15–41)
Albumin: 4.2 g/dL (ref 3.5–5.0)
Alkaline Phosphatase: 53 U/L (ref 38–126)
Anion gap: 12 (ref 5–15)
BUN: 17 mg/dL (ref 8–23)
CO2: 24 mmol/L (ref 22–32)
Calcium: 9.7 mg/dL (ref 8.9–10.3)
Chloride: 102 mmol/L (ref 98–111)
Creatinine, Ser: 1.27 mg/dL — ABNORMAL HIGH (ref 0.44–1.00)
GFR calc Af Amer: 48 mL/min — ABNORMAL LOW (ref >60)
GFR calc non Af Amer: 42 mL/min — ABNORMAL LOW (ref >60)
Glucose, Bld: 118 mg/dL — ABNORMAL HIGH (ref 70–99)
Potassium: 3.5 mmol/L (ref 3.5–5.1)
Sodium: 138 mmol/L (ref 135–145)
Total Bilirubin: 0.8 mg/dL (ref 0.3–1.2)
Total Protein: 7.3 g/dL (ref 6.5–8.1)

## 2017-09-08 LAB — CBC
HCT: 35.5 % — ABNORMAL LOW (ref 36.0–46.0)
Hemoglobin: 11.2 g/dL — ABNORMAL LOW (ref 12.0–15.0)
MCH: 31.3 pg (ref 26.0–34.0)
MCHC: 31.5 g/dL (ref 30.0–36.0)
MCV: 99.2 fL (ref 78.0–100.0)
Platelets: 204 10*3/uL (ref 150–400)
RBC: 3.58 MIL/uL — ABNORMAL LOW (ref 3.87–5.11)
RDW: 13.9 % (ref 11.5–15.5)
WBC: 16.6 10*3/uL — ABNORMAL HIGH (ref 4.0–10.5)

## 2017-09-08 LAB — TYPE AND SCREEN
ABO/RH(D): B NEG
Antibody Screen: NEGATIVE

## 2017-09-08 LAB — LIPASE, BLOOD: Lipase: 28 U/L (ref 11–51)

## 2017-09-08 LAB — ABO/RH: ABO/RH(D): B NEG

## 2017-09-08 MED ORDER — CIPROFLOXACIN HCL 500 MG PO TABS: 500.0000 mg | ORAL_TABLET | Freq: Two times a day (BID) | ORAL | 0 refills | Status: DC

## 2017-09-08 MED ORDER — POLYETHYLENE GLYCOL 3350 17 G PO PACK: 17.0000 g | PACK | Freq: Every day | ORAL | 0 refills | Status: DC

## 2017-09-08 MED ORDER — SODIUM CHLORIDE 0.9 % IV BOLUS
500.0000 mL | Freq: Once | INTRAVENOUS | Status: AC
  Administered 2017-09-08: 500 mL via INTRAVENOUS

## 2017-09-08 MED ORDER — FENTANYL CITRATE (PF) 100 MCG/2ML IJ SOLN
100.0000 ug | Freq: Once | INTRAMUSCULAR | Status: AC
  Administered 2017-09-08: 100 ug via INTRAVENOUS
  Filled 2017-09-08: qty 2

## 2017-09-08 MED ORDER — HYDROCODONE-ACETAMINOPHEN 5-325 MG PO TABS: 1.0000 | ORAL_TABLET | ORAL | 0 refills | Status: DC | PRN

## 2017-09-08 MED ORDER — IOHEXOL 300 MG/ML  SOLN
100.0000 mL | Freq: Once | INTRAMUSCULAR | Status: AC | PRN
  Administered 2017-09-08: 80 mL via INTRAVENOUS

## 2017-09-08 MED ORDER — METRONIDAZOLE 500 MG PO TABS: 500.0000 mg | ORAL_TABLET | Freq: Three times a day (TID) | ORAL | 0 refills | Status: DC

## 2017-09-08 MED ORDER — ONDANSETRON HCL 4 MG/2ML IJ SOLN
4.0000 mg | Freq: Once | INTRAMUSCULAR | Status: AC
Start: 2017-09-08 — End: 2017-09-08
  Administered 2017-09-08: 4 mg via INTRAVENOUS
  Filled 2017-09-08: qty 2

## 2017-09-08 NOTE — ED Notes (Signed)
Patient transported to CT 

## 2017-09-08 NOTE — Telephone Encounter (Signed)
Telephone Note  1030 patient called describing abdominal pain, diarrhea and bleeding from the rectum since 1:30 PM on 09/07/2017.  Has continued with multiple bloody bowel movements and abdominal cramping which is so severe that "I do not know if I can make it to the hospital".  Apparently patient is traveling back from [place] to [place] while talking to me.  She tells me she is going to come to the [facility], ER.  She would like to be followed by her doctor.  Dr. Collene Mares.  Explained that she does need to proceed to the ER.  Likely they will do imaging and labs.  We are covering for Dr. Collene Mares this weekend.  The ER will call and consult [place] if they feel they need our services.  She verbalized understanding.  Ellouise Newer, PA-C

## 2017-09-08 NOTE — ED Triage Notes (Signed)
Pt presents for evaluation of abdominal pain starting yesterday AM. Pt reports urgency of stool yesterday and bright red blood.

## 2017-09-08 NOTE — ED Provider Notes (Signed)
[facility] DEPART[facility] Provider Note   CSN: [id number] Arrival date & time: 09/08/17  1334     History   Chief Complaint Chief Complaint  Patient presents with  . Abdominal Pain    HPI April Simmons is a 72 y.o. female.  HPI Patient presents with abdominal pain.  Lower abdomen.  Severe.  States she initially thought it was her irritable bowel but now she thinks it is different.  Has had some chills.  Decreased appetite.  She is worried she has diverticulitis.  Has had previous diverticulosis diagnosed.  Pain began yesterday morning.  It is severe.  Also has had some blood in the stool.  Both mixed with the stool and on the toilet paper.  She is not on anticoagulation.  Her gastroenterologist is Dr. Collene Mares. Past Medical History:  Diagnosis Date  . Arthritis   . Multiple environmental allergies     Patient Active Problem List   Diagnosis Date Noted  . Essential hypertension 02/18/2013  . Hyperlipidemia 02/18/2013  . Abnormal ECG 02/18/2013    Past Surgical History:  Procedure Laterality Date  . ABDOMINAL HYSTERECTOMY  1998  . ANTERIOR CERVICAL DECOMP/DISCECTOMY FUSION N/A 01/07/2013   Procedure: Anterior Cervical Decompression Fusion Cervical Four-Five, Five-Six;  Surgeon: Eustace Moore, MD;  Location: [facility] NEURO ORS;  Service: Neurosurgery;  Laterality: N/A;  Anterior Cervical Decompression Fusion Cervical Four-Five, Five-Six  . APPENDECTOMY  2001  . BREAST SURGERY  [phone]   x4  . DILATION AND CURETTAGE OF UTERUS     x3  . TONSILLECTOMY  1971     OB History   None      Home Medications    Prior to Admission medications   Medication Sig Start Date End Date Taking? Authorizing Provider  acetaminophen (TYLENOL) 500 MG tablet Take 500-1,000 mg by mouth every 6 (six) hours as needed for mild pain or headache.   Yes [provider]  b complex vitamins tablet Take 1 tablet by mouth daily.   Yes [provider]    Bioflavonoid Products (ESTER C PO) Take 1 tablet by mouth daily.   Yes [provider]  Calcium Citrate-Vitamin D (CITRACAL + D PO) Take 1,260 mg by mouth 2 (two) times daily.   Yes [provider]  estradiol (CLIMARA - DOSED IN MG/24 HR) 0.05 mg/24hr patch Place 0.05 mg onto the skin once a week. On monday 08/07/17  Yes [provider]  estradiol (ESTRACE) 0.1 MG/GM vaginal cream Place 1 Applicatorful vaginally once a week. On mondays   Yes [provider]  fluconazole (DIFLUCAN) 200 MG tablet Take 200 mg by mouth once a week. On fridays   Yes [provider]  halobetasol (ULTRAVATE) 0.05 % cream Apply 1 application topically once a week. On mondays   Yes [provider]  hydrocortisone (ANUSOL-HC) 25 MG suppository Place 25 mg vaginally every Monday. Inserts onto the vagina on Monday nights for vaginal inflammation.   Yes [provider]  levocetirizine (XYZAL) 5 MG tablet Take 5 mg by mouth every evening.   Yes [provider]  montelukast (SINGULAIR) 10 MG tablet Take 10 mg by mouth at bedtime.   Yes [provider]  Multiple Vitamin (MULTIVITAMIN WITH MINERALS) TABS tablet Take 1 tablet by mouth daily.   Yes [provider]  NAPROXEN PO Take 1 tablet by mouth daily as needed (Shoulder pain).    Yes [provider]  Omega-3 Fatty Acids (FISH OIL)  1000 MG CAPS Take by mouth 2 (two) times daily.   Yes [provider]  Probiotic Product (ALIGN PO) Take 1 tablet by mouth daily.   Yes [provider]  ciprofloxacin (CIPRO) 500 MG tablet Take 1 tablet (500 mg total) by mouth 2 (two) times daily. 09/08/17   Davonna Belling, MD  doxycycline (VIBRA-TABS) 100 MG tablet Take 1 tablet (100 mg total) by mouth 2 (two) times daily. Patient not taking: Reported on 09/08/2017 06/26/17   Edrick Kins, DPM  fluticasone [facility]) 50 MCG/ACT nasal spray Place 2 sprays into the nose daily as needed.     [provider]  HYDROcodone-acetaminophen (NORCO/VICODIN) 5-325 MG tablet Take 1-2 tablets by mouth every 4 (four) hours as needed. 09/08/17   Davonna Belling, MD  metroNIDAZOLE (FLAGYL) 500 MG tablet Take 1 tablet (500 mg total) by mouth 3 (three) times daily. 09/08/17   Davonna Belling, MD  polyethylene glycol [facility] / GLYCOLAX) packet Take 17 g by mouth daily. 09/08/17   Davonna Belling, MD    Family History No family history on file.  Social History Social History   Tobacco Use  . Smoking status: Former Smoker    Years: 3.00    Types: Cigarettes    Last attempt to quit: 12/31/1978    Years since quitting: 38.7  . Smokeless tobacco: Never Used  Substance Use Topics  . Alcohol use: Yes    Comment: wine rarely  . Drug use: No     Allergies   Adhesive [tape]   Review of Systems Review of Systems  Constitutional: Positive for appetite change and chills.  Gastrointestinal: Positive for abdominal pain and diarrhea. Negative for nausea and vomiting.     Physical Exam Updated Vital Signs BP 129/64   Pulse 67   Temp 98 F (36.7 C) (Oral)   Resp 18   SpO2 98%   Physical Exam  Constitutional: She appears well-developed.  Patient appears uncomfortable  HENT:  Head: Normocephalic.  Cardiovascular: Normal rate and regular rhythm.  Pulmonary/Chest: Breath sounds normal.  Abdominal:  Moderate left lower quadrant tenderness.  No hernia.  No rebound or guarding.  Neurological: She is alert.  Skin: Skin is warm. Capillary refill takes less than 2 seconds.  Psychiatric: She has a normal mood and affect.     ED Treatments / Results  Labs (all labs ordered are listed, but only abnormal results are displayed) Labs Reviewed  COMPREHENSIVE METABOLIC PANEL - Abnormal; Notable for the following components:      Result Value   Glucose, Bld 118 (*)    Creatinine, Ser 1.27 (*)    GFR calc non Af Amer 42 (*)    GFR calc Af Amer 48 (*)    All other components  within normal limits  CBC - Abnormal; Notable for the following components:   WBC 16.6 (*)    RBC 3.58 (*)    Hemoglobin 11.2 (*)    HCT 35.5 (*)    All other components within normal limits  LIPASE, BLOOD  TYPE AND SCREEN  ABO/RH    EKG None  Radiology Ct Abdomen Pelvis W Contrast  Result Date: 09/08/2017 CLINICAL DATA:  72 y/o F; lower abdominal pain with bloody diarrhea. EXAM: CT ABDOMEN AND PELVIS WITH CONTRAST TECHNIQUE: Multidetector CT imaging of the abdomen and pelvis was performed using the standard protocol following bolus administration of intravenous contrast. CONTRAST:  34mL OMNIPAQUE IOHEXOL 300 MG/ML  SOLN COMPARISON:  None. FINDINGS: Lower chest: 3 mm  nodule in the right middle lobe (series 3, image 12). Hepatobiliary: 10 mm lucent focus in liver segment 7 without enhancement on delayed phase, likely cyst. No other focal liver lesion. Radiolucent gallstones. No biliary ductal dilatation. Pancreas: Unremarkable. No pancreatic ductal dilatation or surrounding inflammatory changes. Spleen: Scattered calcified granulomata. Adrenals/Urinary Tract: Adrenal glands are unremarkable. Kidneys are normal, without renal calculi, focal lesion, or hydronephrosis. Bladder is unremarkable. Stomach/Bowel: Stomach is within normal limits. Acute colitis from the distal transverse segment to the rectum with wall thickening and mild surrounding inflammatory changes and fat. No evidence for perforation or abscess. Moderate sigmoid diverticulosis. Appendectomy. No obstructive or inflammatory changes of small bowel or proximal large bowel. Vascular/Lymphatic: Aortic atherosclerosis. No enlarged abdominal or pelvic lymph nodes. Reproductive: Status post hysterectomy. No adnexal masses. Other: No abdominal wall hernia. Musculoskeletal: Moderate discogenic degenerative changes of the lumbar spine at L3 through L5. No acute fracture identified. IMPRESSION: 1. Acute colitis from the distal transverse segment to  rectum. No perforation or abscess. 2. Moderate sigmoid diverticulosis. 3. Radiolucent gallstones. 4. 3 mm nodule in right middle lobe of the lung. No follow-up needed if patient is low-risk. Non-contrast chest CT can be considered in 12 months if patient is high-risk. This recommendation follows the consensus statement: Guidelines for Management of Incidental Pulmonary Nodules Detected on CT Images: From the Fleischner Society 2017; Radiology 2017; 284:228-243. 5. Minimal aortic atherosclerosis. Electronically Signed   By: Kristine Garbe M.D.   On: 09/08/2017 20:07    Procedures Procedures (including critical care time)  Medications Ordered in ED Medications  sodium chloride 0.9 % bolus 500 mL (0 mLs Intravenous Stopped 09/08/17 1840)  ondansetron (ZOFRAN) injection 4 mg (4 mg Intravenous Given 09/08/17 1716)  fentaNYL (SUBLIMAZE) injection 100 mcg (100 mcg Intravenous Given 09/08/17 1716)  iohexol (OMNIPAQUE) 300 MG/ML solution 100 mL (80 mLs Intravenous Contrast Given 09/08/17 1851)     Initial Impression / Assessment and Plan / ED Course  I have reviewed the triage vital signs and the nursing notes.  Pertinent labs & imaging results that were available during my care of the patient were reviewed by me and considered in my medical decision making (see chart for details).     Patient with colitis.  Abdominal pain.  Had GI bleed.  Hemoglobin reassuring.  Think she can manage at home.  Will treat with antibiotics.  Follow-up with her gastroenterologist.  Will also need to follow with her PCP regarding her lung nodule.  Final Clinical Impressions(s) / ED Diagnoses   Final diagnoses:  Colitis    ED Discharge Orders        Ordered    ciprofloxacin (CIPRO) 500 MG tablet  2 times daily     09/08/17 2053    metroNIDAZOLE (FLAGYL) 500 MG tablet  3 times daily     09/08/17 2053    HYDROcodone-acetaminophen (NORCO/VICODIN) 5-325 MG tablet  Every 4 hours PRN     09/08/17 2053     polyethylene glycol (MIRALAX / GLYCOLAX) packet  Daily     09/08/17 2053       Davonna Belling, MD 09/09/17 0031

## 2017-09-17 DIAGNOSIS — R194 Change in bowel habit: Secondary | ICD-10-CM | POA: Diagnosis not present

## 2017-09-17 DIAGNOSIS — K8080 Other cholelithiasis without obstruction: Secondary | ICD-10-CM | POA: Diagnosis not present

## 2017-09-17 DIAGNOSIS — K625 Hemorrhage of anus and rectum: Secondary | ICD-10-CM | POA: Diagnosis not present

## 2017-09-17 DIAGNOSIS — K5730 Diverticulosis of large intestine without perforation or abscess without bleeding: Secondary | ICD-10-CM | POA: Diagnosis not present

## 2017-09-17 DIAGNOSIS — R933 Abnormal findings on diagnostic imaging of other parts of digestive tract: Secondary | ICD-10-CM | POA: Diagnosis not present

## 2017-09-26 DIAGNOSIS — Z01812 Encounter for preprocedural laboratory examination: Secondary | ICD-10-CM | POA: Diagnosis not present

## 2017-09-26 DIAGNOSIS — R0781 Pleurodynia: Secondary | ICD-10-CM | POA: Diagnosis not present

## 2017-09-26 DIAGNOSIS — M2242 Chondromalacia patellae, left knee: Secondary | ICD-10-CM | POA: Diagnosis not present

## 2017-10-03 DIAGNOSIS — R0789 Other chest pain: Secondary | ICD-10-CM | POA: Diagnosis not present

## 2017-10-08 DIAGNOSIS — E7800 Pure hypercholesterolemia, unspecified: Secondary | ICD-10-CM | POA: Diagnosis not present

## 2017-10-08 DIAGNOSIS — D638 Anemia in other chronic diseases classified elsewhere: Secondary | ICD-10-CM | POA: Diagnosis not present

## 2017-10-08 DIAGNOSIS — E559 Vitamin D deficiency, unspecified: Secondary | ICD-10-CM | POA: Diagnosis not present

## 2017-10-08 DIAGNOSIS — D649 Anemia, unspecified: Secondary | ICD-10-CM | POA: Diagnosis not present

## 2017-10-08 DIAGNOSIS — Z791 Long term (current) use of non-steroidal anti-inflammatories (NSAID): Secondary | ICD-10-CM | POA: Diagnosis not present

## 2017-10-23 DIAGNOSIS — R911 Solitary pulmonary nodule: Secondary | ICD-10-CM | POA: Diagnosis not present

## 2017-10-23 DIAGNOSIS — E7800 Pure hypercholesterolemia, unspecified: Secondary | ICD-10-CM | POA: Diagnosis not present

## 2017-10-23 DIAGNOSIS — D649 Anemia, unspecified: Secondary | ICD-10-CM | POA: Diagnosis not present

## 2017-10-23 DIAGNOSIS — Z0000 Encounter for general adult medical examination without abnormal findings: Secondary | ICD-10-CM | POA: Diagnosis not present

## 2017-10-24 ENCOUNTER — Other Ambulatory Visit: Payer: Self-pay | Admitting: Internal Medicine

## 2017-10-24 DIAGNOSIS — R911 Solitary pulmonary nodule: Secondary | ICD-10-CM

## 2017-10-29 DIAGNOSIS — Z1212 Encounter for screening for malignant neoplasm of rectum: Secondary | ICD-10-CM | POA: Diagnosis not present

## 2017-10-31 ENCOUNTER — Ambulatory Visit
Admission: RE | Admit: 2017-10-31 | Discharge: 2017-10-31 | Disposition: A | Discharge status: Home / Self Care | Payer: Medicare Other | Source: Ambulatory Visit | Attending: Internal Medicine | Admitting: Internal Medicine

## 2017-10-31 DIAGNOSIS — R911 Solitary pulmonary nodule: Secondary | ICD-10-CM

## 2017-10-31 DIAGNOSIS — S2232XA Fracture of one rib, left side, initial encounter for closed fracture: Secondary | ICD-10-CM | POA: Diagnosis not present

## 2017-11-04 DIAGNOSIS — R748 Abnormal levels of other serum enzymes: Secondary | ICD-10-CM | POA: Diagnosis not present

## 2017-11-07 DIAGNOSIS — N760 Acute vaginitis: Secondary | ICD-10-CM | POA: Diagnosis not present

## 2017-11-07 DIAGNOSIS — N6011 Diffuse cystic mastopathy of right breast: Secondary | ICD-10-CM | POA: Diagnosis not present

## 2017-11-07 DIAGNOSIS — R928 Other abnormal and inconclusive findings on diagnostic imaging of breast: Secondary | ICD-10-CM | POA: Diagnosis not present

## 2017-11-07 DIAGNOSIS — Z79890 Hormone replacement therapy (postmenopausal): Secondary | ICD-10-CM | POA: Diagnosis not present

## 2017-11-07 DIAGNOSIS — Z124 Encounter for screening for malignant neoplasm of cervix: Secondary | ICD-10-CM | POA: Diagnosis not present

## 2017-11-07 DIAGNOSIS — N393 Stress incontinence (female) (male): Secondary | ICD-10-CM | POA: Diagnosis not present

## 2017-11-14 DIAGNOSIS — M7582 Other shoulder lesions, left shoulder: Secondary | ICD-10-CM | POA: Diagnosis not present

## 2017-11-18 DIAGNOSIS — E7800 Pure hypercholesterolemia, unspecified: Secondary | ICD-10-CM | POA: Diagnosis not present

## 2017-11-18 DIAGNOSIS — E559 Vitamin D deficiency, unspecified: Secondary | ICD-10-CM | POA: Diagnosis not present

## 2017-11-18 DIAGNOSIS — Z79899 Other long term (current) drug therapy: Secondary | ICD-10-CM | POA: Diagnosis not present

## 2017-11-18 DIAGNOSIS — Z0000 Encounter for general adult medical examination without abnormal findings: Secondary | ICD-10-CM | POA: Diagnosis not present

## 2017-12-05 DIAGNOSIS — R748 Abnormal levels of other serum enzymes: Secondary | ICD-10-CM | POA: Diagnosis not present

## 2017-12-05 DIAGNOSIS — D1771 Benign lipomatous neoplasm of kidney: Secondary | ICD-10-CM | POA: Diagnosis not present

## 2017-12-06 DIAGNOSIS — Z23 Encounter for immunization: Secondary | ICD-10-CM | POA: Diagnosis not present

## 2017-12-12 DIAGNOSIS — S83242A Other tear of medial meniscus, current injury, left knee, initial encounter: Secondary | ICD-10-CM | POA: Diagnosis not present

## 2017-12-13 DIAGNOSIS — M19012 Primary osteoarthritis, left shoulder: Secondary | ICD-10-CM | POA: Diagnosis not present

## 2017-12-13 DIAGNOSIS — M19019 Primary osteoarthritis, unspecified shoulder: Secondary | ICD-10-CM | POA: Diagnosis not present

## 2017-12-26 DIAGNOSIS — M19019 Primary osteoarthritis, unspecified shoulder: Secondary | ICD-10-CM | POA: Diagnosis not present

## 2017-12-26 DIAGNOSIS — M19012 Primary osteoarthritis, left shoulder: Secondary | ICD-10-CM | POA: Diagnosis not present

## 2017-12-31 DIAGNOSIS — N3946 Mixed incontinence: Secondary | ICD-10-CM | POA: Diagnosis not present

## 2017-12-31 DIAGNOSIS — R351 Nocturia: Secondary | ICD-10-CM | POA: Diagnosis not present

## 2018-01-02 DIAGNOSIS — S83242A Other tear of medial meniscus, current injury, left knee, initial encounter: Secondary | ICD-10-CM | POA: Diagnosis not present

## 2018-01-05 DIAGNOSIS — M25562 Pain in left knee: Secondary | ICD-10-CM | POA: Diagnosis not present

## 2018-01-14 DIAGNOSIS — M25562 Pain in left knee: Secondary | ICD-10-CM | POA: Diagnosis not present

## 2018-01-14 DIAGNOSIS — M76892 Other specified enthesopathies of left lower limb, excluding foot: Secondary | ICD-10-CM | POA: Diagnosis not present

## 2018-01-14 DIAGNOSIS — M6281 Muscle weakness (generalized): Secondary | ICD-10-CM | POA: Diagnosis not present

## 2018-01-14 DIAGNOSIS — M7652 Patellar tendinitis, left knee: Secondary | ICD-10-CM | POA: Diagnosis not present

## 2018-01-15 DIAGNOSIS — N3946 Mixed incontinence: Secondary | ICD-10-CM | POA: Diagnosis not present

## 2018-01-15 DIAGNOSIS — R351 Nocturia: Secondary | ICD-10-CM | POA: Diagnosis not present

## 2018-01-21 DIAGNOSIS — M76892 Other specified enthesopathies of left lower limb, excluding foot: Secondary | ICD-10-CM | POA: Diagnosis not present

## 2018-01-21 DIAGNOSIS — M6281 Muscle weakness (generalized): Secondary | ICD-10-CM | POA: Diagnosis not present

## 2018-01-21 DIAGNOSIS — M25562 Pain in left knee: Secondary | ICD-10-CM | POA: Diagnosis not present

## 2018-01-21 DIAGNOSIS — M7652 Patellar tendinitis, left knee: Secondary | ICD-10-CM | POA: Diagnosis not present

## 2018-01-23 DIAGNOSIS — M7652 Patellar tendinitis, left knee: Secondary | ICD-10-CM | POA: Diagnosis not present

## 2018-01-23 DIAGNOSIS — M25562 Pain in left knee: Secondary | ICD-10-CM | POA: Diagnosis not present

## 2018-01-23 DIAGNOSIS — M76892 Other specified enthesopathies of left lower limb, excluding foot: Secondary | ICD-10-CM | POA: Diagnosis not present

## 2018-01-23 DIAGNOSIS — M6281 Muscle weakness (generalized): Secondary | ICD-10-CM | POA: Diagnosis not present

## 2018-01-27 DIAGNOSIS — M7652 Patellar tendinitis, left knee: Secondary | ICD-10-CM | POA: Diagnosis not present

## 2018-01-27 DIAGNOSIS — M25562 Pain in left knee: Secondary | ICD-10-CM | POA: Diagnosis not present

## 2018-01-27 DIAGNOSIS — M6281 Muscle weakness (generalized): Secondary | ICD-10-CM | POA: Diagnosis not present

## 2018-01-27 DIAGNOSIS — M76892 Other specified enthesopathies of left lower limb, excluding foot: Secondary | ICD-10-CM | POA: Diagnosis not present

## 2018-01-30 DIAGNOSIS — M7652 Patellar tendinitis, left knee: Secondary | ICD-10-CM | POA: Diagnosis not present

## 2018-01-30 DIAGNOSIS — M6281 Muscle weakness (generalized): Secondary | ICD-10-CM | POA: Diagnosis not present

## 2018-01-30 DIAGNOSIS — M76892 Other specified enthesopathies of left lower limb, excluding foot: Secondary | ICD-10-CM | POA: Diagnosis not present

## 2018-01-30 DIAGNOSIS — M25562 Pain in left knee: Secondary | ICD-10-CM | POA: Diagnosis not present

## 2018-02-04 DIAGNOSIS — N3946 Mixed incontinence: Secondary | ICD-10-CM | POA: Diagnosis not present

## 2018-02-04 DIAGNOSIS — R351 Nocturia: Secondary | ICD-10-CM | POA: Diagnosis not present

## 2018-02-05 DIAGNOSIS — M25562 Pain in left knee: Secondary | ICD-10-CM | POA: Diagnosis not present

## 2018-02-05 DIAGNOSIS — M6281 Muscle weakness (generalized): Secondary | ICD-10-CM | POA: Diagnosis not present

## 2018-02-05 DIAGNOSIS — M7652 Patellar tendinitis, left knee: Secondary | ICD-10-CM | POA: Diagnosis not present

## 2018-02-05 DIAGNOSIS — M76892 Other specified enthesopathies of left lower limb, excluding foot: Secondary | ICD-10-CM | POA: Diagnosis not present

## 2018-02-11 DIAGNOSIS — M7652 Patellar tendinitis, left knee: Secondary | ICD-10-CM | POA: Diagnosis not present

## 2018-02-11 DIAGNOSIS — M6281 Muscle weakness (generalized): Secondary | ICD-10-CM | POA: Diagnosis not present

## 2018-02-11 DIAGNOSIS — M76892 Other specified enthesopathies of left lower limb, excluding foot: Secondary | ICD-10-CM | POA: Diagnosis not present

## 2018-02-11 DIAGNOSIS — M25562 Pain in left knee: Secondary | ICD-10-CM | POA: Diagnosis not present

## 2018-02-11 DIAGNOSIS — N898 Other specified noninflammatory disorders of vagina: Secondary | ICD-10-CM | POA: Diagnosis not present

## 2018-02-11 DIAGNOSIS — L439 Lichen planus, unspecified: Secondary | ICD-10-CM | POA: Diagnosis not present

## 2018-02-13 DIAGNOSIS — H1045 Other chronic allergic conjunctivitis: Secondary | ICD-10-CM | POA: Diagnosis not present

## 2018-02-13 DIAGNOSIS — J3089 Other allergic rhinitis: Secondary | ICD-10-CM | POA: Diagnosis not present

## 2018-02-13 DIAGNOSIS — J069 Acute upper respiratory infection, unspecified: Secondary | ICD-10-CM | POA: Diagnosis not present

## 2018-03-19 DIAGNOSIS — R351 Nocturia: Secondary | ICD-10-CM | POA: Diagnosis not present

## 2018-03-19 DIAGNOSIS — N3946 Mixed incontinence: Secondary | ICD-10-CM | POA: Diagnosis not present

## 2018-03-21 DIAGNOSIS — K589 Irritable bowel syndrome without diarrhea: Secondary | ICD-10-CM | POA: Diagnosis not present

## 2018-03-21 DIAGNOSIS — M19012 Primary osteoarthritis, left shoulder: Secondary | ICD-10-CM | POA: Diagnosis not present

## 2018-03-21 DIAGNOSIS — I519 Heart disease, unspecified: Secondary | ICD-10-CM | POA: Diagnosis not present

## 2018-03-21 DIAGNOSIS — E785 Hyperlipidemia, unspecified: Secondary | ICD-10-CM | POA: Diagnosis not present

## 2018-03-21 DIAGNOSIS — Z853 Personal history of malignant neoplasm of breast: Secondary | ICD-10-CM | POA: Diagnosis not present

## 2018-03-21 DIAGNOSIS — Z87891 Personal history of nicotine dependence: Secondary | ICD-10-CM | POA: Diagnosis not present

## 2018-03-28 DIAGNOSIS — M25612 Stiffness of left shoulder, not elsewhere classified: Secondary | ICD-10-CM | POA: Diagnosis not present

## 2018-03-28 DIAGNOSIS — M25512 Pain in left shoulder: Secondary | ICD-10-CM | POA: Diagnosis not present

## 2018-03-28 DIAGNOSIS — M6281 Muscle weakness (generalized): Secondary | ICD-10-CM | POA: Diagnosis not present

## 2018-03-28 DIAGNOSIS — M19012 Primary osteoarthritis, left shoulder: Secondary | ICD-10-CM | POA: Diagnosis not present

## 2018-04-02 DIAGNOSIS — M19012 Primary osteoarthritis, left shoulder: Secondary | ICD-10-CM | POA: Diagnosis not present

## 2018-04-03 DIAGNOSIS — M19012 Primary osteoarthritis, left shoulder: Secondary | ICD-10-CM | POA: Diagnosis not present

## 2018-04-03 DIAGNOSIS — M6281 Muscle weakness (generalized): Secondary | ICD-10-CM | POA: Diagnosis not present

## 2018-04-03 DIAGNOSIS — M25612 Stiffness of left shoulder, not elsewhere classified: Secondary | ICD-10-CM | POA: Diagnosis not present

## 2018-04-03 DIAGNOSIS — M25512 Pain in left shoulder: Secondary | ICD-10-CM | POA: Diagnosis not present

## 2018-04-10 DIAGNOSIS — M25612 Stiffness of left shoulder, not elsewhere classified: Secondary | ICD-10-CM | POA: Diagnosis not present

## 2018-04-10 DIAGNOSIS — M25512 Pain in left shoulder: Secondary | ICD-10-CM | POA: Diagnosis not present

## 2018-04-10 DIAGNOSIS — M6281 Muscle weakness (generalized): Secondary | ICD-10-CM | POA: Diagnosis not present

## 2018-04-10 DIAGNOSIS — M19012 Primary osteoarthritis, left shoulder: Secondary | ICD-10-CM | POA: Diagnosis not present

## 2018-04-17 DIAGNOSIS — M19012 Primary osteoarthritis, left shoulder: Secondary | ICD-10-CM | POA: Diagnosis not present

## 2018-04-17 DIAGNOSIS — M6281 Muscle weakness (generalized): Secondary | ICD-10-CM | POA: Diagnosis not present

## 2018-04-17 DIAGNOSIS — M25612 Stiffness of left shoulder, not elsewhere classified: Secondary | ICD-10-CM | POA: Diagnosis not present

## 2018-04-17 DIAGNOSIS — M25512 Pain in left shoulder: Secondary | ICD-10-CM | POA: Diagnosis not present

## 2018-04-22 DIAGNOSIS — M6281 Muscle weakness (generalized): Secondary | ICD-10-CM | POA: Diagnosis not present

## 2018-04-22 DIAGNOSIS — M25512 Pain in left shoulder: Secondary | ICD-10-CM | POA: Diagnosis not present

## 2018-04-22 DIAGNOSIS — M19012 Primary osteoarthritis, left shoulder: Secondary | ICD-10-CM | POA: Diagnosis not present

## 2018-04-22 DIAGNOSIS — M25612 Stiffness of left shoulder, not elsewhere classified: Secondary | ICD-10-CM | POA: Diagnosis not present

## 2018-04-23 DIAGNOSIS — J3089 Other allergic rhinitis: Secondary | ICD-10-CM | POA: Diagnosis not present

## 2018-04-23 DIAGNOSIS — H1045 Other chronic allergic conjunctivitis: Secondary | ICD-10-CM | POA: Diagnosis not present

## 2018-04-25 DIAGNOSIS — M25612 Stiffness of left shoulder, not elsewhere classified: Secondary | ICD-10-CM | POA: Diagnosis not present

## 2018-04-25 DIAGNOSIS — M25512 Pain in left shoulder: Secondary | ICD-10-CM | POA: Diagnosis not present

## 2018-04-25 DIAGNOSIS — M19012 Primary osteoarthritis, left shoulder: Secondary | ICD-10-CM | POA: Diagnosis not present

## 2018-04-25 DIAGNOSIS — M6281 Muscle weakness (generalized): Secondary | ICD-10-CM | POA: Diagnosis not present

## 2018-05-01 DIAGNOSIS — G8918 Other acute postprocedural pain: Secondary | ICD-10-CM | POA: Diagnosis not present

## 2018-05-01 DIAGNOSIS — Z9889 Other specified postprocedural states: Secondary | ICD-10-CM | POA: Diagnosis not present

## 2018-05-02 DIAGNOSIS — M25612 Stiffness of left shoulder, not elsewhere classified: Secondary | ICD-10-CM | POA: Diagnosis not present

## 2018-05-02 DIAGNOSIS — M6281 Muscle weakness (generalized): Secondary | ICD-10-CM | POA: Diagnosis not present

## 2018-05-02 DIAGNOSIS — M19012 Primary osteoarthritis, left shoulder: Secondary | ICD-10-CM | POA: Diagnosis not present

## 2018-05-02 DIAGNOSIS — M25512 Pain in left shoulder: Secondary | ICD-10-CM | POA: Diagnosis not present

## 2018-05-09 DIAGNOSIS — M25512 Pain in left shoulder: Secondary | ICD-10-CM | POA: Diagnosis not present

## 2018-05-09 DIAGNOSIS — M19012 Primary osteoarthritis, left shoulder: Secondary | ICD-10-CM | POA: Diagnosis not present

## 2018-05-09 DIAGNOSIS — M25612 Stiffness of left shoulder, not elsewhere classified: Secondary | ICD-10-CM | POA: Diagnosis not present

## 2018-05-09 DIAGNOSIS — M6281 Muscle weakness (generalized): Secondary | ICD-10-CM | POA: Diagnosis not present

## 2018-05-14 DIAGNOSIS — M25512 Pain in left shoulder: Secondary | ICD-10-CM | POA: Diagnosis not present

## 2018-05-14 DIAGNOSIS — R351 Nocturia: Secondary | ICD-10-CM | POA: Diagnosis not present

## 2018-05-14 DIAGNOSIS — M25612 Stiffness of left shoulder, not elsewhere classified: Secondary | ICD-10-CM | POA: Diagnosis not present

## 2018-05-14 DIAGNOSIS — N3946 Mixed incontinence: Secondary | ICD-10-CM | POA: Diagnosis not present

## 2018-05-14 DIAGNOSIS — M6281 Muscle weakness (generalized): Secondary | ICD-10-CM | POA: Diagnosis not present

## 2018-05-14 DIAGNOSIS — M19012 Primary osteoarthritis, left shoulder: Secondary | ICD-10-CM | POA: Diagnosis not present

## 2018-05-16 DIAGNOSIS — M6281 Muscle weakness (generalized): Secondary | ICD-10-CM | POA: Diagnosis not present

## 2018-05-16 DIAGNOSIS — M25512 Pain in left shoulder: Secondary | ICD-10-CM | POA: Diagnosis not present

## 2018-05-16 DIAGNOSIS — M25612 Stiffness of left shoulder, not elsewhere classified: Secondary | ICD-10-CM | POA: Diagnosis not present

## 2018-05-16 DIAGNOSIS — M19012 Primary osteoarthritis, left shoulder: Secondary | ICD-10-CM | POA: Diagnosis not present

## 2018-05-20 DIAGNOSIS — M6281 Muscle weakness (generalized): Secondary | ICD-10-CM | POA: Diagnosis not present

## 2018-05-20 DIAGNOSIS — M25612 Stiffness of left shoulder, not elsewhere classified: Secondary | ICD-10-CM | POA: Diagnosis not present

## 2018-05-20 DIAGNOSIS — M19012 Primary osteoarthritis, left shoulder: Secondary | ICD-10-CM | POA: Diagnosis not present

## 2018-05-20 DIAGNOSIS — M25512 Pain in left shoulder: Secondary | ICD-10-CM | POA: Diagnosis not present

## 2018-05-23 DIAGNOSIS — M25512 Pain in left shoulder: Secondary | ICD-10-CM | POA: Diagnosis not present

## 2018-05-23 DIAGNOSIS — M25612 Stiffness of left shoulder, not elsewhere classified: Secondary | ICD-10-CM | POA: Diagnosis not present

## 2018-05-23 DIAGNOSIS — M19012 Primary osteoarthritis, left shoulder: Secondary | ICD-10-CM | POA: Diagnosis not present

## 2018-05-23 DIAGNOSIS — M6281 Muscle weakness (generalized): Secondary | ICD-10-CM | POA: Diagnosis not present

## 2018-05-27 DIAGNOSIS — M25512 Pain in left shoulder: Secondary | ICD-10-CM | POA: Diagnosis not present

## 2018-05-27 DIAGNOSIS — M25612 Stiffness of left shoulder, not elsewhere classified: Secondary | ICD-10-CM | POA: Diagnosis not present

## 2018-05-27 DIAGNOSIS — M6281 Muscle weakness (generalized): Secondary | ICD-10-CM | POA: Diagnosis not present

## 2018-05-27 DIAGNOSIS — M19012 Primary osteoarthritis, left shoulder: Secondary | ICD-10-CM | POA: Diagnosis not present

## 2018-05-30 DIAGNOSIS — M6281 Muscle weakness (generalized): Secondary | ICD-10-CM | POA: Diagnosis not present

## 2018-05-30 DIAGNOSIS — M25512 Pain in left shoulder: Secondary | ICD-10-CM | POA: Diagnosis not present

## 2018-05-30 DIAGNOSIS — M25612 Stiffness of left shoulder, not elsewhere classified: Secondary | ICD-10-CM | POA: Diagnosis not present

## 2018-05-30 DIAGNOSIS — M19012 Primary osteoarthritis, left shoulder: Secondary | ICD-10-CM | POA: Diagnosis not present

## 2018-06-03 DIAGNOSIS — M25612 Stiffness of left shoulder, not elsewhere classified: Secondary | ICD-10-CM | POA: Diagnosis not present

## 2018-06-03 DIAGNOSIS — M25512 Pain in left shoulder: Secondary | ICD-10-CM | POA: Diagnosis not present

## 2018-06-03 DIAGNOSIS — M6281 Muscle weakness (generalized): Secondary | ICD-10-CM | POA: Diagnosis not present

## 2018-06-03 DIAGNOSIS — M19012 Primary osteoarthritis, left shoulder: Secondary | ICD-10-CM | POA: Diagnosis not present

## 2018-06-06 DIAGNOSIS — M6281 Muscle weakness (generalized): Secondary | ICD-10-CM | POA: Diagnosis not present

## 2018-06-06 DIAGNOSIS — M25512 Pain in left shoulder: Secondary | ICD-10-CM | POA: Diagnosis not present

## 2018-06-06 DIAGNOSIS — M25612 Stiffness of left shoulder, not elsewhere classified: Secondary | ICD-10-CM | POA: Diagnosis not present

## 2018-06-06 DIAGNOSIS — M19012 Primary osteoarthritis, left shoulder: Secondary | ICD-10-CM | POA: Diagnosis not present

## 2018-06-10 DIAGNOSIS — M19012 Primary osteoarthritis, left shoulder: Secondary | ICD-10-CM | POA: Diagnosis not present

## 2018-06-10 DIAGNOSIS — M25512 Pain in left shoulder: Secondary | ICD-10-CM | POA: Diagnosis not present

## 2018-06-10 DIAGNOSIS — M25612 Stiffness of left shoulder, not elsewhere classified: Secondary | ICD-10-CM | POA: Diagnosis not present

## 2018-06-10 DIAGNOSIS — M6281 Muscle weakness (generalized): Secondary | ICD-10-CM | POA: Diagnosis not present

## 2018-06-12 DIAGNOSIS — L910 Hypertrophic scar: Secondary | ICD-10-CM | POA: Diagnosis not present

## 2018-06-13 DIAGNOSIS — M25612 Stiffness of left shoulder, not elsewhere classified: Secondary | ICD-10-CM | POA: Diagnosis not present

## 2018-06-13 DIAGNOSIS — M19012 Primary osteoarthritis, left shoulder: Secondary | ICD-10-CM | POA: Diagnosis not present

## 2018-06-13 DIAGNOSIS — M25512 Pain in left shoulder: Secondary | ICD-10-CM | POA: Diagnosis not present

## 2018-06-13 DIAGNOSIS — M6281 Muscle weakness (generalized): Secondary | ICD-10-CM | POA: Diagnosis not present

## 2018-07-04 DIAGNOSIS — M7582 Other shoulder lesions, left shoulder: Secondary | ICD-10-CM | POA: Insufficient documentation

## 2018-07-23 DIAGNOSIS — L910 Hypertrophic scar: Secondary | ICD-10-CM | POA: Diagnosis not present

## 2018-08-12 DIAGNOSIS — Z803 Family history of malignant neoplasm of breast: Secondary | ICD-10-CM | POA: Diagnosis not present

## 2018-08-12 DIAGNOSIS — C50912 Malignant neoplasm of unspecified site of left female breast: Secondary | ICD-10-CM | POA: Diagnosis not present

## 2018-08-12 DIAGNOSIS — C50911 Malignant neoplasm of unspecified site of right female breast: Secondary | ICD-10-CM | POA: Diagnosis not present

## 2018-08-26 DIAGNOSIS — N952 Postmenopausal atrophic vaginitis: Secondary | ICD-10-CM | POA: Diagnosis not present

## 2018-08-26 DIAGNOSIS — L900 Lichen sclerosus et atrophicus: Secondary | ICD-10-CM | POA: Diagnosis not present

## 2018-08-28 DIAGNOSIS — H524 Presbyopia: Secondary | ICD-10-CM | POA: Diagnosis not present

## 2018-08-28 DIAGNOSIS — H2513 Age-related nuclear cataract, bilateral: Secondary | ICD-10-CM | POA: Diagnosis not present

## 2018-09-03 DIAGNOSIS — L738 Other specified follicular disorders: Secondary | ICD-10-CM | POA: Diagnosis not present

## 2018-09-03 DIAGNOSIS — L910 Hypertrophic scar: Secondary | ICD-10-CM | POA: Diagnosis not present

## 2018-09-03 DIAGNOSIS — L821 Other seborrheic keratosis: Secondary | ICD-10-CM | POA: Diagnosis not present

## 2018-10-17 DIAGNOSIS — Z981 Arthrodesis status: Secondary | ICD-10-CM | POA: Diagnosis not present

## 2018-10-17 DIAGNOSIS — M19012 Primary osteoarthritis, left shoulder: Secondary | ICD-10-CM | POA: Diagnosis not present

## 2018-10-17 DIAGNOSIS — M25512 Pain in left shoulder: Secondary | ICD-10-CM | POA: Diagnosis not present

## 2018-10-17 DIAGNOSIS — M7582 Other shoulder lesions, left shoulder: Secondary | ICD-10-CM | POA: Diagnosis not present

## 2018-10-18 DIAGNOSIS — M75102 Unspecified rotator cuff tear or rupture of left shoulder, not specified as traumatic: Secondary | ICD-10-CM | POA: Diagnosis not present

## 2018-10-28 DIAGNOSIS — M7522 Bicipital tendinitis, left shoulder: Secondary | ICD-10-CM | POA: Diagnosis not present

## 2018-10-28 DIAGNOSIS — M75112 Incomplete rotator cuff tear or rupture of left shoulder, not specified as traumatic: Secondary | ICD-10-CM | POA: Insufficient documentation

## 2018-10-28 DIAGNOSIS — M7582 Other shoulder lesions, left shoulder: Secondary | ICD-10-CM | POA: Diagnosis not present

## 2018-11-14 DIAGNOSIS — M7582 Other shoulder lesions, left shoulder: Secondary | ICD-10-CM | POA: Diagnosis not present

## 2018-11-14 DIAGNOSIS — Z01812 Encounter for preprocedural laboratory examination: Secondary | ICD-10-CM | POA: Diagnosis not present

## 2018-11-14 DIAGNOSIS — Z20828 Contact with and (suspected) exposure to other viral communicable diseases: Secondary | ICD-10-CM | POA: Diagnosis not present

## 2018-11-14 DIAGNOSIS — Z1159 Encounter for screening for other viral diseases: Secondary | ICD-10-CM | POA: Diagnosis not present

## 2018-11-20 DIAGNOSIS — F5104 Psychophysiologic insomnia: Secondary | ICD-10-CM | POA: Diagnosis not present

## 2018-11-21 DIAGNOSIS — Z8261 Family history of arthritis: Secondary | ICD-10-CM | POA: Diagnosis not present

## 2018-11-21 DIAGNOSIS — Z87891 Personal history of nicotine dependence: Secondary | ICD-10-CM | POA: Diagnosis not present

## 2018-11-21 DIAGNOSIS — M94212 Chondromalacia, left shoulder: Secondary | ICD-10-CM | POA: Diagnosis not present

## 2018-11-21 DIAGNOSIS — M94211 Chondromalacia, right shoulder: Secondary | ICD-10-CM | POA: Diagnosis not present

## 2018-11-21 DIAGNOSIS — J309 Allergic rhinitis, unspecified: Secondary | ICD-10-CM | POA: Diagnosis not present

## 2018-11-21 DIAGNOSIS — M7541 Impingement syndrome of right shoulder: Secondary | ICD-10-CM | POA: Diagnosis not present

## 2018-11-21 DIAGNOSIS — G8918 Other acute postprocedural pain: Secondary | ICD-10-CM | POA: Diagnosis not present

## 2018-11-21 DIAGNOSIS — M19011 Primary osteoarthritis, right shoulder: Secondary | ICD-10-CM | POA: Diagnosis not present

## 2018-11-21 DIAGNOSIS — M7582 Other shoulder lesions, left shoulder: Secondary | ICD-10-CM | POA: Diagnosis not present

## 2018-11-21 DIAGNOSIS — M7522 Bicipital tendinitis, left shoulder: Secondary | ICD-10-CM | POA: Diagnosis not present

## 2018-11-21 DIAGNOSIS — M75112 Incomplete rotator cuff tear or rupture of left shoulder, not specified as traumatic: Secondary | ICD-10-CM | POA: Diagnosis not present

## 2018-11-21 DIAGNOSIS — E785 Hyperlipidemia, unspecified: Secondary | ICD-10-CM | POA: Diagnosis not present

## 2018-11-21 DIAGNOSIS — M65811 Other synovitis and tenosynovitis, right shoulder: Secondary | ICD-10-CM | POA: Diagnosis not present

## 2018-11-21 DIAGNOSIS — M19012 Primary osteoarthritis, left shoulder: Secondary | ICD-10-CM | POA: Diagnosis not present

## 2018-11-21 DIAGNOSIS — M7521 Bicipital tendinitis, right shoulder: Secondary | ICD-10-CM | POA: Diagnosis not present

## 2018-11-21 DIAGNOSIS — M65812 Other synovitis and tenosynovitis, left shoulder: Secondary | ICD-10-CM | POA: Diagnosis not present

## 2018-11-21 DIAGNOSIS — S43432A Superior glenoid labrum lesion of left shoulder, initial encounter: Secondary | ICD-10-CM | POA: Diagnosis not present

## 2018-11-21 DIAGNOSIS — Z853 Personal history of malignant neoplasm of breast: Secondary | ICD-10-CM | POA: Diagnosis not present

## 2018-11-26 DIAGNOSIS — M25612 Stiffness of left shoulder, not elsewhere classified: Secondary | ICD-10-CM | POA: Diagnosis not present

## 2018-11-26 DIAGNOSIS — N760 Acute vaginitis: Secondary | ICD-10-CM | POA: Diagnosis not present

## 2018-11-26 DIAGNOSIS — Z79890 Hormone replacement therapy (postmenopausal): Secondary | ICD-10-CM | POA: Diagnosis not present

## 2018-11-26 DIAGNOSIS — M6281 Muscle weakness (generalized): Secondary | ICD-10-CM | POA: Diagnosis not present

## 2018-11-26 DIAGNOSIS — M25512 Pain in left shoulder: Secondary | ICD-10-CM | POA: Diagnosis not present

## 2018-11-26 DIAGNOSIS — M7542 Impingement syndrome of left shoulder: Secondary | ICD-10-CM | POA: Diagnosis not present

## 2018-11-26 DIAGNOSIS — Z124 Encounter for screening for malignant neoplasm of cervix: Secondary | ICD-10-CM | POA: Diagnosis not present

## 2018-11-26 DIAGNOSIS — Z01419 Encounter for gynecological examination (general) (routine) without abnormal findings: Secondary | ICD-10-CM | POA: Diagnosis not present

## 2018-12-01 DIAGNOSIS — M25512 Pain in left shoulder: Secondary | ICD-10-CM | POA: Diagnosis not present

## 2018-12-01 DIAGNOSIS — M6281 Muscle weakness (generalized): Secondary | ICD-10-CM | POA: Diagnosis not present

## 2018-12-01 DIAGNOSIS — M25612 Stiffness of left shoulder, not elsewhere classified: Secondary | ICD-10-CM | POA: Diagnosis not present

## 2018-12-01 DIAGNOSIS — M7542 Impingement syndrome of left shoulder: Secondary | ICD-10-CM | POA: Diagnosis not present

## 2018-12-03 DIAGNOSIS — M25512 Pain in left shoulder: Secondary | ICD-10-CM | POA: Diagnosis not present

## 2018-12-03 DIAGNOSIS — M6281 Muscle weakness (generalized): Secondary | ICD-10-CM | POA: Diagnosis not present

## 2018-12-03 DIAGNOSIS — M25612 Stiffness of left shoulder, not elsewhere classified: Secondary | ICD-10-CM | POA: Diagnosis not present

## 2018-12-03 DIAGNOSIS — M7542 Impingement syndrome of left shoulder: Secondary | ICD-10-CM | POA: Diagnosis not present

## 2018-12-10 DIAGNOSIS — L821 Other seborrheic keratosis: Secondary | ICD-10-CM | POA: Diagnosis not present

## 2018-12-10 DIAGNOSIS — L820 Inflamed seborrheic keratosis: Secondary | ICD-10-CM | POA: Diagnosis not present

## 2018-12-10 DIAGNOSIS — Z86018 Personal history of other benign neoplasm: Secondary | ICD-10-CM | POA: Diagnosis not present

## 2018-12-10 DIAGNOSIS — D2230 Melanocytic nevi of unspecified part of face: Secondary | ICD-10-CM | POA: Diagnosis not present

## 2018-12-10 DIAGNOSIS — D225 Melanocytic nevi of trunk: Secondary | ICD-10-CM | POA: Diagnosis not present

## 2018-12-10 DIAGNOSIS — L814 Other melanin hyperpigmentation: Secondary | ICD-10-CM | POA: Diagnosis not present

## 2018-12-11 DIAGNOSIS — M6281 Muscle weakness (generalized): Secondary | ICD-10-CM | POA: Diagnosis not present

## 2018-12-11 DIAGNOSIS — M25612 Stiffness of left shoulder, not elsewhere classified: Secondary | ICD-10-CM | POA: Diagnosis not present

## 2018-12-11 DIAGNOSIS — M7542 Impingement syndrome of left shoulder: Secondary | ICD-10-CM | POA: Diagnosis not present

## 2018-12-11 DIAGNOSIS — M25512 Pain in left shoulder: Secondary | ICD-10-CM | POA: Diagnosis not present

## 2018-12-17 DIAGNOSIS — E559 Vitamin D deficiency, unspecified: Secondary | ICD-10-CM | POA: Diagnosis not present

## 2018-12-17 DIAGNOSIS — D649 Anemia, unspecified: Secondary | ICD-10-CM | POA: Diagnosis not present

## 2018-12-17 DIAGNOSIS — Z23 Encounter for immunization: Secondary | ICD-10-CM | POA: Diagnosis not present

## 2018-12-17 DIAGNOSIS — E7800 Pure hypercholesterolemia, unspecified: Secondary | ICD-10-CM | POA: Diagnosis not present

## 2018-12-19 DIAGNOSIS — M25612 Stiffness of left shoulder, not elsewhere classified: Secondary | ICD-10-CM | POA: Diagnosis not present

## 2018-12-19 DIAGNOSIS — M6281 Muscle weakness (generalized): Secondary | ICD-10-CM | POA: Diagnosis not present

## 2018-12-19 DIAGNOSIS — D649 Anemia, unspecified: Secondary | ICD-10-CM | POA: Diagnosis not present

## 2018-12-19 DIAGNOSIS — M25512 Pain in left shoulder: Secondary | ICD-10-CM | POA: Diagnosis not present

## 2018-12-19 DIAGNOSIS — M7542 Impingement syndrome of left shoulder: Secondary | ICD-10-CM | POA: Diagnosis not present

## 2018-12-24 DIAGNOSIS — M25612 Stiffness of left shoulder, not elsewhere classified: Secondary | ICD-10-CM | POA: Diagnosis not present

## 2018-12-24 DIAGNOSIS — M7522 Bicipital tendinitis, left shoulder: Secondary | ICD-10-CM | POA: Diagnosis not present

## 2018-12-24 DIAGNOSIS — M25512 Pain in left shoulder: Secondary | ICD-10-CM | POA: Diagnosis not present

## 2018-12-24 DIAGNOSIS — M6281 Muscle weakness (generalized): Secondary | ICD-10-CM | POA: Diagnosis not present

## 2018-12-31 DIAGNOSIS — E7800 Pure hypercholesterolemia, unspecified: Secondary | ICD-10-CM | POA: Diagnosis not present

## 2018-12-31 DIAGNOSIS — D649 Anemia, unspecified: Secondary | ICD-10-CM | POA: Diagnosis not present

## 2018-12-31 DIAGNOSIS — Z0000 Encounter for general adult medical examination without abnormal findings: Secondary | ICD-10-CM | POA: Diagnosis not present

## 2019-01-02 DIAGNOSIS — M25512 Pain in left shoulder: Secondary | ICD-10-CM | POA: Diagnosis not present

## 2019-01-02 DIAGNOSIS — M7542 Impingement syndrome of left shoulder: Secondary | ICD-10-CM | POA: Diagnosis not present

## 2019-01-02 DIAGNOSIS — M25612 Stiffness of left shoulder, not elsewhere classified: Secondary | ICD-10-CM | POA: Diagnosis not present

## 2019-01-02 DIAGNOSIS — M6281 Muscle weakness (generalized): Secondary | ICD-10-CM | POA: Diagnosis not present

## 2019-01-09 DIAGNOSIS — M25512 Pain in left shoulder: Secondary | ICD-10-CM | POA: Diagnosis not present

## 2019-01-09 DIAGNOSIS — M25612 Stiffness of left shoulder, not elsewhere classified: Secondary | ICD-10-CM | POA: Diagnosis not present

## 2019-01-09 DIAGNOSIS — M7542 Impingement syndrome of left shoulder: Secondary | ICD-10-CM | POA: Diagnosis not present

## 2019-01-09 DIAGNOSIS — M6281 Muscle weakness (generalized): Secondary | ICD-10-CM | POA: Diagnosis not present

## 2019-01-16 DIAGNOSIS — M25612 Stiffness of left shoulder, not elsewhere classified: Secondary | ICD-10-CM | POA: Diagnosis not present

## 2019-01-16 DIAGNOSIS — M6281 Muscle weakness (generalized): Secondary | ICD-10-CM | POA: Diagnosis not present

## 2019-01-16 DIAGNOSIS — M7542 Impingement syndrome of left shoulder: Secondary | ICD-10-CM | POA: Diagnosis not present

## 2019-01-16 DIAGNOSIS — M25512 Pain in left shoulder: Secondary | ICD-10-CM | POA: Diagnosis not present

## 2019-01-23 DIAGNOSIS — M6281 Muscle weakness (generalized): Secondary | ICD-10-CM | POA: Diagnosis not present

## 2019-01-23 DIAGNOSIS — M7542 Impingement syndrome of left shoulder: Secondary | ICD-10-CM | POA: Diagnosis not present

## 2019-01-23 DIAGNOSIS — M25612 Stiffness of left shoulder, not elsewhere classified: Secondary | ICD-10-CM | POA: Diagnosis not present

## 2019-01-23 DIAGNOSIS — M25512 Pain in left shoulder: Secondary | ICD-10-CM | POA: Diagnosis not present

## 2019-01-29 DIAGNOSIS — D649 Anemia, unspecified: Secondary | ICD-10-CM | POA: Diagnosis not present

## 2019-01-30 DIAGNOSIS — M6281 Muscle weakness (generalized): Secondary | ICD-10-CM | POA: Diagnosis not present

## 2019-01-30 DIAGNOSIS — M25612 Stiffness of left shoulder, not elsewhere classified: Secondary | ICD-10-CM | POA: Diagnosis not present

## 2019-01-30 DIAGNOSIS — M7542 Impingement syndrome of left shoulder: Secondary | ICD-10-CM | POA: Diagnosis not present

## 2019-01-30 DIAGNOSIS — M25512 Pain in left shoulder: Secondary | ICD-10-CM | POA: Diagnosis not present

## 2019-02-03 ENCOUNTER — Other Ambulatory Visit: Payer: Self-pay

## 2019-02-04 DIAGNOSIS — M6281 Muscle weakness (generalized): Secondary | ICD-10-CM | POA: Diagnosis not present

## 2019-02-04 DIAGNOSIS — M7542 Impingement syndrome of left shoulder: Secondary | ICD-10-CM | POA: Diagnosis not present

## 2019-02-04 DIAGNOSIS — M25512 Pain in left shoulder: Secondary | ICD-10-CM | POA: Diagnosis not present

## 2019-02-04 DIAGNOSIS — M25612 Stiffness of left shoulder, not elsewhere classified: Secondary | ICD-10-CM | POA: Diagnosis not present

## 2019-02-13 ENCOUNTER — Telehealth: Payer: Self-pay | Admitting: Oncology

## 2019-02-13 DIAGNOSIS — M25612 Stiffness of left shoulder, not elsewhere classified: Secondary | ICD-10-CM | POA: Diagnosis not present

## 2019-02-13 DIAGNOSIS — M6281 Muscle weakness (generalized): Secondary | ICD-10-CM | POA: Diagnosis not present

## 2019-02-13 DIAGNOSIS — M25512 Pain in left shoulder: Secondary | ICD-10-CM | POA: Diagnosis not present

## 2019-02-13 DIAGNOSIS — M7542 Impingement syndrome of left shoulder: Secondary | ICD-10-CM | POA: Diagnosis not present

## 2019-02-13 NOTE — Telephone Encounter (Signed)
Received a new hem referral from Dr. Shelia Media for IDA. Ms. Fegley has been cld and scheduled to see Dr. Alen Blew on 12/15 at 11am. Pt aware to arrive 15 minutes early. Letter mailed.

## 2019-02-20 DIAGNOSIS — M6281 Muscle weakness (generalized): Secondary | ICD-10-CM | POA: Diagnosis not present

## 2019-02-20 DIAGNOSIS — M25612 Stiffness of left shoulder, not elsewhere classified: Secondary | ICD-10-CM | POA: Diagnosis not present

## 2019-02-20 DIAGNOSIS — M25512 Pain in left shoulder: Secondary | ICD-10-CM | POA: Diagnosis not present

## 2019-02-20 DIAGNOSIS — M7542 Impingement syndrome of left shoulder: Secondary | ICD-10-CM | POA: Diagnosis not present

## 2019-02-24 ENCOUNTER — Inpatient Hospital Stay: Payer: Medicare Other | Attending: Oncology | Admitting: Oncology

## 2019-02-24 ENCOUNTER — Other Ambulatory Visit: Payer: Self-pay

## 2019-02-24 VITALS — BP 148/74 | HR 73 | Resp 22

## 2019-02-24 DIAGNOSIS — D649 Anemia, unspecified: Secondary | ICD-10-CM | POA: Diagnosis not present

## 2019-02-24 DIAGNOSIS — D529 Folate deficiency anemia, unspecified: Secondary | ICD-10-CM

## 2019-02-24 DIAGNOSIS — E538 Deficiency of other specified B group vitamins: Secondary | ICD-10-CM

## 2019-02-24 NOTE — Progress Notes (Signed)
Reason for the request:   Anemia  HPI: I was asked by Dr. Shelia Media to evaluate April Simmons for diagnosis of anemia.  She is a 73 year old woman with history of IBS and chronic anemia was noted to be anemic in October 2020.  She was prescribed oral iron replacement and CBC obtained on 01/29/2019 showed a white cell count of 5.1, hemoglobin of 11.3 MCV of 94 with platelet count of 278.  Her ferritin was 172.  Her neutrophil percentage is 23% with a slight monocytosis.  Previous his CBC on September 08, 2017 showed white cell count of 16.6 and a hemoglobin of 11.2.  CBC obtained in October 2014 showed a hemoglobin of 12.2 white cell count of 4.6 and platelet count 236.  He is asymptomatic at this time and does not report any complaints.  She did have 2 surgeries last year which could have contributed to her anemia periodic fatigue.  She denies any hematochezia or melena.  Her Hemoccult testing has been negative.  He denies any hemoptysis or hematemesis.  Performance status quality of life is unchanged.  She does not report any headaches, blurry vision, syncope or seizures. Does not report any fevers, chills or sweats.  Does not report any cough, wheezing or hemoptysis.  Does not report any chest pain, palpitation, orthopnea or leg edema.  Does not report any nausea, vomiting or abdominal pain.  Does not report any constipation or diarrhea.  Does not report any skeletal complaints.    Does not report frequency, urgency or hematuria.  Does not report any skin rashes or lesions. Does not report any heat or cold intolerance.  Does not report any lymphadenopathy or petechiae.  Does not report any anxiety or depression.  Remaining review of systems is negative.    Past Medical History:  Diagnosis Date  . Arthritis   . Multiple environmental allergies   :  Past Surgical History:  Procedure Laterality Date  . ABDOMINAL HYSTERECTOMY  1998  . ANTERIOR CERVICAL DECOMP/DISCECTOMY FUSION N/A 01/07/2013   Procedure:  Anterior Cervical Decompression Fusion Cervical Four-Five, Five-Six;  Surgeon: Eustace Moore, MD;  Location: [facility] NEURO ORS;  Service: Neurosurgery;  Laterality: N/A;  Anterior Cervical Decompression Fusion Cervical Four-Five, Five-Six  . APPENDECTOMY  2001  . BREAST SURGERY  [phone]   x4  . DILATION AND CURETTAGE OF UTERUS     x3  . TONSILLECTOMY  1971  :   Current Outpatient Medications:  .  acetaminophen (TYLENOL) 500 MG tablet, Take 500-1,000 mg by mouth every 6 (six) hours as needed for mild pain or headache., Disp: , Rfl:  .  b complex vitamins tablet, Take 1 tablet by mouth daily., Disp: , Rfl:  .  Bioflavonoid Products (ESTER C PO), Take 1 tablet by mouth daily., Disp: , Rfl:  .  Calcium Citrate-Vitamin D (CITRACAL + D PO), Take 1,260 mg by mouth 2 (two) times daily., Disp: , Rfl:  .  ciprofloxacin (CIPRO) 500 MG tablet, Take 1 tablet (500 mg total) by mouth 2 (two) times daily., Disp: 14 tablet, Rfl: 0 .  doxycycline (VIBRA-TABS) 100 MG tablet, Take 1 tablet (100 mg total) by mouth 2 (two) times daily. (Patient not taking: Reported on 09/08/2017), Disp: 20 tablet, Rfl: 0 .  estradiol (CLIMARA - DOSED IN MG/24 HR) 0.05 mg/24hr patch, Place 0.05 mg onto the skin once a week. On monday, Disp: , Rfl: 12 .  estradiol (ESTRACE) 0.1 MG/GM vaginal cream, Place 1 Applicatorful vaginally once a week. On  mondays, Disp: , Rfl:  .  fluconazole (DIFLUCAN) 200 MG tablet, Take 200 mg by mouth once a week. On fridays, Disp: , Rfl:  .  fluticasone (FLONASE) 50 MCG/ACT nasal spray, Place 2 sprays into the nose daily as needed., Disp: , Rfl:  .  halobetasol (ULTRAVATE) 0.05 % cream, Apply 1 application topically once a week. On mondays, Disp: , Rfl:  .  HYDROcodone-acetaminophen (NORCO/VICODIN) 5-325 MG tablet, Take 1-2 tablets by mouth every 4 (four) hours as needed., Disp: 8 tablet, Rfl: 0 .  hydrocortisone (ANUSOL-HC) 25 MG suppository, Place 25 mg vaginally every Monday. Inserts onto the vagina on  Monday nights for vaginal inflammation., Disp: , Rfl:  .  levocetirizine (XYZAL) 5 MG tablet, Take 5 mg by mouth every evening., Disp: , Rfl:  .  metroNIDAZOLE (FLAGYL) 500 MG tablet, Take 1 tablet (500 mg total) by mouth 3 (three) times daily., Disp: 21 tablet, Rfl: 0 .  montelukast (SINGULAIR) 10 MG tablet, Take 10 mg by mouth at bedtime., Disp: , Rfl:  .  Multiple Vitamin (MULTIVITAMIN WITH MINERALS) TABS tablet, Take 1 tablet by mouth daily., Disp: , Rfl:  .  NAPROXEN PO, Take 1 tablet by mouth daily as needed (Shoulder pain). , Disp: , Rfl:  .  Omega-3 Fatty Acids (FISH OIL) 1000 MG CAPS, Take by mouth 2 (two) times daily., Disp: , Rfl:  .  polyethylene glycol (MIRALAX / GLYCOLAX) packet, Take 17 g by mouth daily., Disp: 14 each, Rfl: 0 .  Probiotic Product (ALIGN PO), Take 1 tablet by mouth daily., Disp: , Rfl: :  Allergies  Allergen Reactions  . Adhesive [Tape] Itching  :  No family history on file.:  Social History   Socioeconomic History  . Marital status: Married    Spouse name: Not on file  . Number of children: Not on file  . Years of education: Not on file  . Highest education level: Not on file  Occupational History  . Not on file  Tobacco Use  . Smoking status: Former Smoker    Years: 3.00    Types: Cigarettes    Quit date: 12/31/1978    Years since quitting: 40.1  . Smokeless tobacco: Never Used  Substance and Sexual Activity  . Alcohol use: Yes    Comment: wine rarely  . Drug use: No  . Sexual activity: Not on file  Other Topics Concern  . Not on file  Social History Narrative  . Not on file   Social Determinants of Health   Financial Resource Strain:   . Difficulty of Paying Living Expenses: Not on file  Food Insecurity:   . Worried About Charity fundraiser in the Last Year: Not on file  . Ran Out of Food in the Last Year: Not on file  Transportation Needs:   . Lack of Transportation (Medical): Not on file  . Lack of Transportation  (Non-Medical): Not on file  Physical Activity:   . Days of Exercise per Week: Not on file  . Minutes of Exercise per Session: Not on file  Stress:   . Feeling of Stress : Not on file  Social Connections:   . Frequency of Communication with Friends and Family: Not on file  . Frequency of Social Gatherings with Friends and Family: Not on file  . Attends Religious Services: Not on file  . Active Member of Clubs or Organizations: Not on file  . Attends Archivist Meetings: Not on file  . Marital  Status: Not on file  Intimate Partner Violence:   . Fear of Current or Ex-Partner: Not on file  . Emotionally Abused: Not on file  . Physically Abused: Not on file  . Sexually Abused: Not on file  :  Pertinent items are noted in HPI.  Exam: Blood pressure (!) 148/74, pulse 73, resp. rate (!) 22, SpO2 97 %.   ECOG 0  General appearance: alert and cooperative appeared without distress. Head: atraumatic without any abnormalities. Eyes: conjunctivae/corneas clear. PERRL.  Sclera anicteric. Throat: lips, mucosa, and tongue normal; without oral thrush or ulcers. Resp: clear to auscultation bilaterally without rhonchi, wheezes or dullness to percussion. Cardio: regular rate and rhythm, S1, S2 normal, no murmur, click, rub or gallop GI: soft, non-tender; bowel sounds normal; no masses,  no organomegaly Skin: Skin color, texture, turgor normal. No rashes or lesions Lymph nodes: Cervical, supraclavicular, and axillary nodes normal. Neurologic: Grossly normal without any motor, sensory or deep tendon reflexes. Musculoskeletal: No joint deformity or effusion.  CBC    Component Value Date/Time   WBC 16.6 (H) 09/08/2017 1357   RBC 3.58 (L) 09/08/2017 1357   HGB 11.2 (L) 09/08/2017 1357   HCT 35.5 (L) 09/08/2017 1357   PLT 204 09/08/2017 1357   MCV 99.2 09/08/2017 1357   MCH 31.3 09/08/2017 1357   MCHC 31.5 09/08/2017 1357   RDW 13.9 09/08/2017 1357   LYMPHSABS 1.9 12/30/2012 1042    MONOABS 0.5 12/30/2012 1042   EOSABS 0.2 12/30/2012 1042   BASOSABS 0.0 12/30/2012 1042     Assessment and Plan:    73 year old woman with:  1.  Multifactorial anemia that appears to be chronic in nature with hemoglobin fluctuates between 11 and 12 for many years per her report but has been noted as such the last year and a half.  She has been on oral iron replacement with improvement in her hemoglobin and ferritin has improved as well.  The differential diagnosis was reviewed today.  Iron deficiency anemia, anemia of chronic disease as well as early myelodysplasia could be a consideration.  Anemia related to renal insufficiency could also be a consideration given her slight decrease in her GFR.  Given the fact that she has an element of iron deficiency which has improved with iron replacement, I have recommended continuing temporary oral iron replacement for the time being.  I will repeat laboratory testing in the next few months with repeat iron levels as well as serum protein electrophoresis in addition to E23 and folic acid levels to complete her work-up.  For the time being her anemia is mild and asymptomatic and does not require any additional intervention.  Bone marrow biopsy will be deferred at this time.  This will be considered if worsening cytopenias are noted.  2.  Neutropenia: Unclear etiology at this time.  He has element of monocytosis as well which could be a sign of early myelodysplastic syndrome.  For the time being we will continue to monitor and consider bone marrow biopsy if he is problems persist or worsen.  3.  Follow-up in the next 2 to 3 months for repeat evaluation.  40  minutes was spent with the patient face-to-face today.  More than 50% of time was spent on reviewing laboratory data, discussing differential diagnosis and management options for the future.    Thank you for the referral.  A copy of this consult has been forwarded to the requesting physician.

## 2019-02-25 ENCOUNTER — Telehealth: Payer: Self-pay | Admitting: Oncology

## 2019-02-25 NOTE — Telephone Encounter (Signed)
Scheduled appt per 12/15 los.  Spoke with pt and she is aware of the appt date and time. 

## 2019-04-02 ENCOUNTER — Ambulatory Visit: Payer: Medicare Other | Attending: Internal Medicine

## 2019-04-02 DIAGNOSIS — Z23 Encounter for immunization: Secondary | ICD-10-CM | POA: Insufficient documentation

## 2019-04-02 NOTE — Progress Notes (Signed)
   Covid-19 Vaccination Clinic  Name:  April Simmons    MRN: [id number] DOB: 12/13/1945  04/02/2019  April Simmons was observed post Covid-19 immunization for 15 minutes without incidence. She was provided with Vaccine Information Sheet and instruction to access the V-Safe system.   April Simmons was instructed to call 911 with any severe reactions post vaccine: Marland Kitchen Difficulty breathing  . Swelling of your face and throat  . A fast heartbeat  . A bad rash all over your body  . Dizziness and weakness    Immunizations Administered    Name Date Dose VIS Date Route   Pfizer COVID-19 Vaccine 04/02/2019 10:58 AM 0.3 mL 02/20/2019 Intramuscular   Manufacturer: [place]   Lot: [id number]   [place]: [id number]

## 2019-04-07 ENCOUNTER — Ambulatory Visit: Payer: Medicare Other

## 2019-04-23 ENCOUNTER — Ambulatory Visit: Payer: Medicare Other | Attending: Internal Medicine

## 2019-04-23 DIAGNOSIS — Z23 Encounter for immunization: Secondary | ICD-10-CM | POA: Insufficient documentation

## 2019-04-23 NOTE — Progress Notes (Signed)
   Covid-19 Vaccination Clinic  Name:  YEILA LICANO    MRN: [id number] DOB: 06-Apr-1945  04/23/2019  Ms. Celio was observed post Covid-19 immunization for 15 minutes without incidence. She was provided with Vaccine Information Sheet and instruction to access the V-Safe system.   Ms. Langolf was instructed to call 911 with any severe reactions post vaccine: Marland Kitchen Difficulty breathing  . Swelling of your face and throat  . A fast heartbeat  . A bad rash all over your body  . Dizziness and weakness    Immunizations Administered    Name Date Dose VIS Date Route   Pfizer COVID-19 Vaccine 04/23/2019 10:33 AM 0.3 mL 02/20/2019 Intramuscular   Manufacturer: [place]   Lot: [id number]   [place]: [id number]

## 2019-04-25 ENCOUNTER — Ambulatory Visit: Payer: Medicare Other

## 2019-04-28 ENCOUNTER — Other Ambulatory Visit: Payer: Self-pay

## 2019-04-28 ENCOUNTER — Inpatient Hospital Stay: Payer: Medicare Other | Attending: Oncology

## 2019-04-28 DIAGNOSIS — Z79899 Other long term (current) drug therapy: Secondary | ICD-10-CM | POA: Insufficient documentation

## 2019-04-28 DIAGNOSIS — E538 Deficiency of other specified B group vitamins: Secondary | ICD-10-CM

## 2019-04-28 DIAGNOSIS — D709 Neutropenia, unspecified: Secondary | ICD-10-CM | POA: Insufficient documentation

## 2019-04-28 DIAGNOSIS — D469 Myelodysplastic syndrome, unspecified: Secondary | ICD-10-CM | POA: Diagnosis not present

## 2019-04-28 DIAGNOSIS — D890 Polyclonal hypergammaglobulinemia: Secondary | ICD-10-CM | POA: Diagnosis not present

## 2019-04-28 DIAGNOSIS — Z791 Long term (current) use of non-steroidal anti-inflammatories (NSAID): Secondary | ICD-10-CM | POA: Diagnosis not present

## 2019-04-28 DIAGNOSIS — D649 Anemia, unspecified: Secondary | ICD-10-CM

## 2019-04-28 DIAGNOSIS — D529 Folate deficiency anemia, unspecified: Secondary | ICD-10-CM

## 2019-04-28 LAB — CBC WITH DIFFERENTIAL (CANCER CENTER ONLY)
Abs Immature Granulocytes: 0.02 10*3/uL (ref 0.00–0.07)
Basophils Absolute: 0 10*3/uL (ref 0.0–0.1)
Basophils Relative: 0 %
Eosinophils Absolute: 0.1 10*3/uL (ref 0.0–0.5)
Eosinophils Relative: 1 %
HCT: 33.3 % — ABNORMAL LOW (ref 36.0–46.0)
Hemoglobin: 10.9 g/dL — ABNORMAL LOW (ref 12.0–15.0)
Immature Granulocytes: 0 %
Lymphocytes Relative: 46 %
Lymphs Abs: 2.1 10*3/uL (ref 0.7–4.0)
MCH: 29.4 pg (ref 26.0–34.0)
MCHC: 32.7 g/dL (ref 30.0–36.0)
MCV: 89.8 fL (ref 80.0–100.0)
Monocytes Absolute: 1.3 10*3/uL — ABNORMAL HIGH (ref 0.1–1.0)
Monocytes Relative: 28 %
Neutro Abs: 1.2 10*3/uL — ABNORMAL LOW (ref 1.7–7.7)
Neutrophils Relative %: 25 %
Platelet Count: 199 10*3/uL (ref 150–400)
RBC: 3.71 MIL/uL — ABNORMAL LOW (ref 3.87–5.11)
RDW: 13 % (ref 11.5–15.5)
WBC Count: 4.6 10*3/uL (ref 4.0–10.5)
nRBC: 0 % (ref 0.0–0.2)

## 2019-04-28 LAB — IRON AND TIBC
Iron: 83 ug/dL (ref 41–142)
Saturation Ratios: 25 % (ref 21–57)
TIBC: 333 ug/dL (ref 236–444)
UIBC: 250 ug/dL (ref 120–384)

## 2019-04-28 LAB — CMP (CANCER CENTER ONLY)
ALT: 20 U/L (ref 0–44)
AST: 27 U/L (ref 15–41)
Albumin: 4 g/dL (ref 3.5–5.0)
Alkaline Phosphatase: 55 U/L (ref 38–126)
Anion gap: 8 (ref 5–15)
BUN: 16 mg/dL (ref 8–23)
CO2: 25 mmol/L (ref 22–32)
Calcium: 9.1 mg/dL (ref 8.9–10.3)
Chloride: 108 mmol/L (ref 98–111)
Creatinine: 1.16 mg/dL — ABNORMAL HIGH (ref 0.44–1.00)
GFR, Est AFR Am: 54 mL/min — ABNORMAL LOW (ref >60)
GFR, Estimated: 47 mL/min — ABNORMAL LOW (ref >60)
Glucose, Bld: 85 mg/dL (ref 70–99)
Potassium: 3.9 mmol/L (ref 3.5–5.1)
Sodium: 141 mmol/L (ref 135–145)
Total Bilirubin: <0.2 mg/dL — ABNORMAL LOW (ref 0.3–1.2)
Total Protein: 7.4 g/dL (ref 6.5–8.1)

## 2019-04-28 LAB — VITAMIN B12: Vitamin B-12: 480 pg/mL (ref 180–914)

## 2019-04-28 LAB — FOLATE: Folate: 13 ng/mL (ref >5.9)

## 2019-04-28 LAB — FERRITIN: Ferritin: 130 ng/mL (ref 11–307)

## 2019-04-30 LAB — MULTIPLE MYELOMA PANEL, SERUM
Albumin SerPl Elph-Mcnc: 4 g/dL (ref 2.9–4.4)
Albumin/Glob SerPl: 1.4 (ref 0.7–1.7)
Alpha 1: 0.2 g/dL (ref 0.0–0.4)
Alpha2 Glob SerPl Elph-Mcnc: 0.6 g/dL (ref 0.4–1.0)
B-Globulin SerPl Elph-Mcnc: 1 g/dL (ref 0.7–1.3)
Gamma Glob SerPl Elph-Mcnc: 1.1 g/dL (ref 0.4–1.8)
Globulin, Total: 2.9 g/dL (ref 2.2–3.9)
IgA: 235 mg/dL (ref 64–422)
IgG (Immunoglobin G), Serum: 1044 mg/dL (ref 586–1602)
IgM (Immunoglobulin M), Srm: 259 mg/dL — ABNORMAL HIGH (ref 26–217)
Total Protein ELP: 6.9 g/dL (ref 6.0–8.5)

## 2019-05-06 ENCOUNTER — Inpatient Hospital Stay (HOSPITAL_BASED_OUTPATIENT_CLINIC_OR_DEPARTMENT_OTHER): Payer: Medicare Other | Admitting: Oncology

## 2019-05-06 ENCOUNTER — Other Ambulatory Visit: Payer: Self-pay

## 2019-05-06 VITALS — BP 140/65 | HR 63 | Temp 98.2°F | Resp 18 | Ht 65.0 in | Wt 160.0 lb

## 2019-05-06 DIAGNOSIS — D469 Myelodysplastic syndrome, unspecified: Secondary | ICD-10-CM | POA: Diagnosis not present

## 2019-05-06 DIAGNOSIS — Z79899 Other long term (current) drug therapy: Secondary | ICD-10-CM | POA: Diagnosis not present

## 2019-05-06 DIAGNOSIS — D709 Neutropenia, unspecified: Secondary | ICD-10-CM | POA: Diagnosis not present

## 2019-05-06 DIAGNOSIS — D890 Polyclonal hypergammaglobulinemia: Secondary | ICD-10-CM | POA: Diagnosis not present

## 2019-05-06 DIAGNOSIS — D649 Anemia, unspecified: Secondary | ICD-10-CM | POA: Diagnosis not present

## 2019-05-06 DIAGNOSIS — Z791 Long term (current) use of non-steroidal anti-inflammatories (NSAID): Secondary | ICD-10-CM | POA: Diagnosis not present

## 2019-05-06 NOTE — Progress Notes (Signed)
Hematology and Oncology Follow Up Visit  DEVRI KREHER [id number] [birth year] 74 y.o. 05/06/2019 9:05 AM Deland Pretty, MDPharr, Thayer Jew, MD   Principle Diagnosis: 74 year old woman with chronic anemia with a hemoglobin range between 10 and 11 diagnosed in 2019.   Prior Therapy: Oral iron replacement therapy.  Current therapy: Active surveillance.  Interim History: Ms. Ureste returns today for a follow-up visit.  Since the last visit, she completed 4 months of oral iron therapy without any major complaints.  She denies any nausea, abdominal distention or dyspepsia.  She denies excessive fatigue or tiredness.  She denies any shortness of breath or difficulty breathing.  Performance status and activity level remains at baseline.     Medications: I have reviewed the patient's current medications.  Current Outpatient Medications  Medication Sig Dispense Refill  . hydrocortisone (ANUSOL-HC) 25 MG suppository Place 25 mg vaginally every Monday. Inserts onto the vagina on Monday nights for vaginal inflammation.    Marland Kitchen levocetirizine (XYZAL) 5 MG tablet Take 5 mg by mouth every evening.    . montelukast (SINGULAIR) 10 MG tablet Take 10 mg by mouth at bedtime.    . Multiple Vitamin (MULTIVITAMIN WITH MINERALS) TABS tablet Take 1 tablet by mouth daily.    . Omega-3 Fatty Acids (FISH OIL) 1000 MG CAPS Take by mouth 2 (two) times daily.    . Probiotic Product (ALIGN PO) Take 1 tablet by mouth daily.    Marland Kitchen acetaminophen (TYLENOL) 500 MG tablet Take 500-1,000 mg by mouth every 6 (six) hours as needed for mild pain or headache.    . b complex vitamins tablet Take 1 tablet by mouth daily.    Marland Kitchen Bioflavonoid Products (ESTER C PO) Take 1 tablet by mouth daily.    . Calcium Citrate-Vitamin D (CITRACAL + D PO) Take 1,260 mg by mouth 2 (two) times daily.    Marland Kitchen estradiol (CLIMARA - DOSED IN MG/24 HR) 0.05 mg/24hr patch Place 0.05 mg onto the skin once a week. On monday  12  . estradiol (ESTRACE) 0.1 MG/GM vaginal  cream Place 1 Applicatorful vaginally once a week. On mondays    . fluconazole (DIFLUCAN) 200 MG tablet Take 200 mg by mouth once a week. On fridays    . fluticasone (FLONASE) 50 MCG/ACT nasal spray Place 2 sprays into the nose daily as needed.    . halobetasol (ULTRAVATE) 0.05 % cream Apply 1 application topically once a week. On mondays    . NAPROXEN PO Take 1 tablet by mouth daily as needed (Shoulder pain).     . polyethylene glycol (MIRALAX / GLYCOLAX) packet Take 17 g by mouth daily. (Patient not taking: Reported on 05/06/2019) 14 each 0   No current facility-administered medications for this visit.     Allergies:  Allergies  Allergen Reactions  . Adhesive [Tape] Itching     Physical Exam: Blood pressure 140/65, pulse 63, temperature 98.2 F (36.8 C), temperature source Temporal, resp. rate 18, height '5\' 5"'  (1.651 m), weight 160 lb (72.6 kg), SpO2 98 %. ECOG: 0    General appearance: Comfortable appearing without any discomfort Head: Normocephalic without any trauma Oropharynx: Mucous membranes are moist and pink without any thrush or ulcers. Eyes: Pupils are equal and round reactive to light. Lymph nodes: No cervical, supraclavicular, inguinal or axillary lymphadenopathy.   Heart:regular rate and rhythm.  S1 and S2 without leg edema. Lung: Clear without any rhonchi or wheezes.  No dullness to percussion. Abdomin: Soft, nontender, nondistended with good bowel sounds.  No hepatosplenomegaly. Musculoskeletal: No joint deformity or effusion.  Full range of motion noted. Neurological: No deficits noted on motor, sensory and deep tendon reflex exam. Skin: No petechial rash or dryness.  Appeared moist.      Lab Results: Lab Results  Component Value Date   WBC 4.6 04/28/2019   HGB 10.9 (L) 04/28/2019   HCT 33.3 (L) 04/28/2019   MCV 89.8 04/28/2019   PLT 199 04/28/2019     Chemistry      Component Value Date/Time   NA 141 04/28/2019 0912   K 3.9 04/28/2019 0912   CL  108 04/28/2019 0912   CO2 25 04/28/2019 0912   BUN 16 04/28/2019 0912   CREATININE 1.16 (H) 04/28/2019 0912      Component Value Date/Time   CALCIUM 9.1 04/28/2019 0912   ALKPHOS 55 04/28/2019 0912   AST 27 04/28/2019 0912   ALT 20 04/28/2019 0912   BILITOT <0.2 (L) 04/28/2019 0912          Impression and Plan:  74 year old woman with:  1.    Mild chronic anemia documented again in 2019.  Per her report, she is always fluctuated with a hemoglobin of 10-12 majority of her life.  The etiology of her anemia appears to be multifactorial related to chronic insufficiency, chronic disease and iron deficiency.  Early myelodysplastic syndrome could not be completely ruled out at this time.  Work-up from April 28, 2019 was discussed today with the patient in detail.  Her hemoglobin continues to be close to baseline and I am dramatically changed at this time.  She has no clear-cut etiology of hematological disorder with polyclonal gammopathy detected on her SPEP.  At this time, I have recommended continued active surveillance without really any intervention.  Bone marrow biopsy would be needed if she develops worsening cytopenias in the future.  At this time she does not require any treatment such as growth factor support and transfusion.  2.  Neutropenia: Absolute neutrophil count is close to normal at this time.  No intervention is needed.  3.  Polyclonal gammopathy: Appears to be reactive in nature and unrelated to plasma cell or blood disorder.  4.  Follow-up: No routine follow-up is needed at this time.  I am happy to see her in the future as needed if she has further drop in her counts.  30  minutes were dedicated to this visit.  The time was spent on reviewing laboratory data, discussing differential diagnosis and management options.     Zola Button, MD 2/24/20219:05 AM

## 2019-05-07 ENCOUNTER — Telehealth: Payer: Self-pay | Admitting: Oncology

## 2019-05-07 NOTE — Telephone Encounter (Signed)
No los per 2/24. 

## 2019-05-15 DIAGNOSIS — N3946 Mixed incontinence: Secondary | ICD-10-CM | POA: Diagnosis not present

## 2019-05-15 DIAGNOSIS — R351 Nocturia: Secondary | ICD-10-CM | POA: Diagnosis not present

## 2019-05-19 DIAGNOSIS — M7522 Bicipital tendinitis, left shoulder: Secondary | ICD-10-CM | POA: Diagnosis not present

## 2019-05-19 DIAGNOSIS — Z9889 Other specified postprocedural states: Secondary | ICD-10-CM | POA: Diagnosis not present

## 2019-05-19 DIAGNOSIS — M75112 Incomplete rotator cuff tear or rupture of left shoulder, not specified as traumatic: Secondary | ICD-10-CM | POA: Diagnosis not present

## 2019-06-01 DIAGNOSIS — Z23 Encounter for immunization: Secondary | ICD-10-CM | POA: Diagnosis not present

## 2019-06-01 DIAGNOSIS — L820 Inflamed seborrheic keratosis: Secondary | ICD-10-CM | POA: Diagnosis not present

## 2019-06-02 DIAGNOSIS — J3089 Other allergic rhinitis: Secondary | ICD-10-CM | POA: Diagnosis not present

## 2019-06-02 DIAGNOSIS — H1045 Other chronic allergic conjunctivitis: Secondary | ICD-10-CM | POA: Diagnosis not present

## 2019-07-06 ENCOUNTER — Other Ambulatory Visit: Payer: Self-pay

## 2019-07-06 ENCOUNTER — Ambulatory Visit (INDEPENDENT_AMBULATORY_CARE_PROVIDER_SITE_OTHER): Payer: Medicare Other | Admitting: Podiatry

## 2019-07-06 DIAGNOSIS — L600 Ingrowing nail: Secondary | ICD-10-CM | POA: Diagnosis not present

## 2019-07-06 MED ORDER — GENTAMICIN SULFATE 0.1 % EX CREA: 1.0000 "application " | TOPICAL_CREAM | Freq: Two times a day (BID) | CUTANEOUS | 1 refills | Status: DC

## 2019-07-06 NOTE — Patient Instructions (Signed)

## 2019-07-07 ENCOUNTER — Telehealth: Payer: Self-pay | Admitting: *Deleted

## 2019-07-07 NOTE — Telephone Encounter (Signed)
Left message informing pt she should shower prior to the epsom salt soaks.

## 2019-07-07 NOTE — Telephone Encounter (Signed)
Pt states she had an ingrown toenail removal yesterday and wanted to know if she could shower before or after the epsom salt soaks.

## 2019-07-09 NOTE — Progress Notes (Signed)
   Subjective: Patient presents today for evaluation of intermittent soreness to the medial and lateral borders of the left great toe that began 2-3 months ago. Patient is concerned for possible ingrown nail. Applying pressure to the toe increases the pain. She has gotten pedicures for treatment with minimal relief. Patient presents today for further treatment and evaluation.  Past Medical History:  Diagnosis Date  . Arthritis   . Multiple environmental allergies     Objective:  General: Well developed, nourished, in no acute distress, alert and oriented x3   Dermatology: Skin is warm, dry and supple bilateral. Medial and lateral borders of the left great toe appears to be erythematous with evidence of an ingrowing nail. Pain on palpation noted to the border of the nail fold. The remaining nails appear unremarkable at this time. There are no open sores, lesions.  Vascular: Dorsalis Pedis artery and Posterior Tibial artery pedal pulses palpable. No lower extremity edema noted.   Neruologic: Grossly intact via light touch bilateral.  Musculoskeletal: Muscular strength within normal limits in all groups bilateral. Normal range of motion noted to all pedal and ankle joints.   Assesement: #1 Paronychia with ingrowing nail medial and lateral borders left great toe #2 Pain in toe #3 Incurvated nail  Plan of Care:  1. Patient evaluated.  2. Discussed treatment alternatives and plan of care. Explained nail avulsion procedure and post procedure course to patient. 3. Patient opted for permanent partial nail avulsion of the medial and lateral borders left great toe.  4. Prior to procedure, local anesthesia infiltration utilized using 3 ml of a 50:50 mixture of 2% plain lidocaine and 0.5% plain marcaine in a normal hallux block fashion and a betadine prep performed.  5. Partial permanent nail avulsion with chemical matrixectomy performed using [id number] applications of phenol followed by alcohol  flush.  6. Light dressing applied. 7. Prescription for Gentamicin cream provided to patient to use daily with a bandage.  8. Return to clinic in 2 weeks.  Edrick Kins, DPM [facility]  Dr. Edrick Kins, [address]                                        [place], [place] [address]                Office [phone]  Fax [phone]

## 2019-07-20 ENCOUNTER — Ambulatory Visit (INDEPENDENT_AMBULATORY_CARE_PROVIDER_SITE_OTHER): Payer: Medicare Other | Admitting: Podiatry

## 2019-07-20 ENCOUNTER — Other Ambulatory Visit: Payer: Self-pay

## 2019-07-20 ENCOUNTER — Telehealth: Payer: Self-pay | Admitting: Podiatry

## 2019-07-20 DIAGNOSIS — L600 Ingrowing nail: Secondary | ICD-10-CM

## 2019-07-20 NOTE — Telephone Encounter (Signed)
Thank you for letting me know. But I did not go over her medications due to she was seen 2 weeks ago. And those medications are already on her med list .

## 2019-07-20 NOTE — Telephone Encounter (Signed)
Pt stated that the assistant did not go over her medication list while in the office and some changes needed to be made. Pt called back and stated the following:  1) [place] Extra Strength Probiotic (no the align po) 2) Red Yeast Rice 600 mg Once a Day 3) No longer on Gentamicin Cream -requested it be removed.

## 2019-07-23 NOTE — Progress Notes (Signed)
   Subjective: 74 y.o. female presents today status post permanent nail avulsion procedure of the medial and lateral borders of the left great toe that was performed on 07/06/2019. She states she is doing well but reports some redness and swelling of the area. She has been using the Gentamicin cream as directed. There are no worsening factors noted. Patient is here for further evaluation and treatment.    Past Medical History:  Diagnosis Date  . Arthritis   . Multiple environmental allergies     Objective: Skin is warm, dry and supple. Nail and respective nail fold appears to be healing appropriately. Open wound to the associated nail fold with a granular wound base and moderate amount of fibrotic tissue. Minimal drainage noted. Mild erythema around the periungual region likely due to phenol chemical matricectomy.  Assessment: #1 postop permanent partial nail avulsion medial and lateral borders of the left hallux  #2 open wound periungual nail fold of respective digit.   Plan of care: #1 patient was evaluated  #2 debridement of open wound was performed to the periungual border of the respective toe using a currette. Antibiotic ointment and Band-Aid was applied. #3 patient is to return to clinic on a PRN basis.   Edrick Kins, DPM [facility]  Dr. Edrick Kins, [address]                                        [place], [place] [address]                Office [phone]  Fax [phone]

## 2019-08-13 DIAGNOSIS — Z0389 Encounter for observation for other suspected diseases and conditions ruled out: Secondary | ICD-10-CM | POA: Diagnosis not present

## 2019-08-13 DIAGNOSIS — Z780 Asymptomatic menopausal state: Secondary | ICD-10-CM | POA: Diagnosis not present

## 2019-08-13 DIAGNOSIS — Z1231 Encounter for screening mammogram for malignant neoplasm of breast: Secondary | ICD-10-CM | POA: Diagnosis not present

## 2019-08-25 DIAGNOSIS — L728 Other follicular cysts of the skin and subcutaneous tissue: Secondary | ICD-10-CM | POA: Diagnosis not present

## 2019-08-25 DIAGNOSIS — N760 Acute vaginitis: Secondary | ICD-10-CM | POA: Diagnosis not present

## 2019-08-25 DIAGNOSIS — L900 Lichen sclerosus et atrophicus: Secondary | ICD-10-CM | POA: Diagnosis not present

## 2019-09-16 ENCOUNTER — Other Ambulatory Visit: Payer: Self-pay

## 2019-09-16 ENCOUNTER — Ambulatory Visit (INDEPENDENT_AMBULATORY_CARE_PROVIDER_SITE_OTHER): Payer: Medicare Other | Admitting: Podiatry

## 2019-09-16 ENCOUNTER — Encounter: Payer: Self-pay | Admitting: Podiatry

## 2019-09-16 DIAGNOSIS — L600 Ingrowing nail: Secondary | ICD-10-CM

## 2019-09-16 NOTE — Progress Notes (Signed)
   Subjective: Patient presents today for evaluation of pain to the lateral border of the bilateral fifth toes.. Patient is concerned for possible ingrown nail.  Patient states that she occasionally gets hangnails to the outside of the fifth toes that she pulls out.  They are very tender with shoes and walking.  Patient presents today for further treatment and evaluation.  Past Medical History:  Diagnosis Date  . Arthritis   . Multiple environmental allergies     Objective:  General: Well developed, nourished, in no acute distress, alert and oriented x3   Dermatology: Skin is warm, dry and supple bilateral.  Lateral border of the bilateral fifth toes appears to be irritated with evidence of an ingrowing nail and nail spicule growing from the lateral border of the toe.  Pain on palpation noted to the border of the nail fold. The remaining nails appear unremarkable at this time. There are no open sores, lesions.  Vascular: Dorsalis Pedis artery and Posterior Tibial artery pedal pulses palpable. No lower extremity edema noted.   Neruologic: Grossly intact via light touch bilateral.  Musculoskeletal: Muscular strength within normal limits in all groups bilateral. Normal range of motion noted to all pedal and ankle joints.   Assesement: #1 Paronychia with ingrowing nail bilateral fifth toes lateral border  Plan of Care:  1. Patient evaluated.  2. Discussed treatment alternatives and plan of care. Explained nail avulsion procedure and post procedure course to patient. 3. Patient opted for permanent partial nail avulsion of the fifth digit bilateral.  4. Prior to procedure, local anesthesia infiltration utilized using 3 ml of a 50:50 mixture of 2% plain lidocaine and 0.5% plain marcaine in a normal hallux block fashion and a betadine prep performed.  5. Partial permanent nail avulsion with chemical matrixectomy performed using [id number] applications of phenol followed by alcohol flush.  6. Light  dressing applied. 7.  Recommend antibiotic ointment and Band-Aid daily  8.  Return to clinic as needed.  Edrick Kins, DPM [facility]  Dr. Edrick Kins, [address]                                        [place], [place] [address]                Office [phone]  Fax [phone]

## 2019-09-18 DIAGNOSIS — M25512 Pain in left shoulder: Secondary | ICD-10-CM | POA: Diagnosis not present

## 2019-09-18 DIAGNOSIS — M6281 Muscle weakness (generalized): Secondary | ICD-10-CM | POA: Diagnosis not present

## 2019-09-22 DIAGNOSIS — M25512 Pain in left shoulder: Secondary | ICD-10-CM | POA: Diagnosis not present

## 2019-09-22 DIAGNOSIS — M6281 Muscle weakness (generalized): Secondary | ICD-10-CM | POA: Diagnosis not present

## 2019-09-25 DIAGNOSIS — M6281 Muscle weakness (generalized): Secondary | ICD-10-CM | POA: Diagnosis not present

## 2019-09-25 DIAGNOSIS — M25512 Pain in left shoulder: Secondary | ICD-10-CM | POA: Diagnosis not present

## 2019-09-28 DIAGNOSIS — M25512 Pain in left shoulder: Secondary | ICD-10-CM | POA: Diagnosis not present

## 2019-09-28 DIAGNOSIS — M6281 Muscle weakness (generalized): Secondary | ICD-10-CM | POA: Diagnosis not present

## 2019-10-01 DIAGNOSIS — M6281 Muscle weakness (generalized): Secondary | ICD-10-CM | POA: Diagnosis not present

## 2019-10-01 DIAGNOSIS — M25512 Pain in left shoulder: Secondary | ICD-10-CM | POA: Diagnosis not present

## 2019-10-09 DIAGNOSIS — M6281 Muscle weakness (generalized): Secondary | ICD-10-CM | POA: Diagnosis not present

## 2019-10-09 DIAGNOSIS — M25512 Pain in left shoulder: Secondary | ICD-10-CM | POA: Diagnosis not present

## 2019-10-13 DIAGNOSIS — M6281 Muscle weakness (generalized): Secondary | ICD-10-CM | POA: Diagnosis not present

## 2019-10-13 DIAGNOSIS — H903 Sensorineural hearing loss, bilateral: Secondary | ICD-10-CM | POA: Diagnosis not present

## 2019-10-13 DIAGNOSIS — M25512 Pain in left shoulder: Secondary | ICD-10-CM | POA: Diagnosis not present

## 2019-10-13 DIAGNOSIS — H6123 Impacted cerumen, bilateral: Secondary | ICD-10-CM | POA: Diagnosis not present

## 2019-11-02 DIAGNOSIS — H903 Sensorineural hearing loss, bilateral: Secondary | ICD-10-CM | POA: Diagnosis not present

## 2019-11-04 DIAGNOSIS — H1045 Other chronic allergic conjunctivitis: Secondary | ICD-10-CM | POA: Diagnosis not present

## 2019-11-04 DIAGNOSIS — J3089 Other allergic rhinitis: Secondary | ICD-10-CM | POA: Diagnosis not present

## 2019-11-04 DIAGNOSIS — R05 Cough: Secondary | ICD-10-CM | POA: Diagnosis not present

## 2019-11-30 DIAGNOSIS — Z01419 Encounter for gynecological examination (general) (routine) without abnormal findings: Secondary | ICD-10-CM | POA: Diagnosis not present

## 2019-11-30 DIAGNOSIS — Z86010 Personal history of colonic polyps: Secondary | ICD-10-CM | POA: Diagnosis not present

## 2019-11-30 DIAGNOSIS — Z79890 Hormone replacement therapy (postmenopausal): Secondary | ICD-10-CM | POA: Diagnosis not present

## 2019-11-30 DIAGNOSIS — Z800 Family history of malignant neoplasm of digestive organs: Secondary | ICD-10-CM | POA: Diagnosis not present

## 2019-11-30 DIAGNOSIS — M546 Pain in thoracic spine: Secondary | ICD-10-CM | POA: Diagnosis not present

## 2019-11-30 DIAGNOSIS — L439 Lichen planus, unspecified: Secondary | ICD-10-CM | POA: Diagnosis not present

## 2019-11-30 DIAGNOSIS — Z803 Family history of malignant neoplasm of breast: Secondary | ICD-10-CM | POA: Diagnosis not present

## 2019-11-30 DIAGNOSIS — Z124 Encounter for screening for malignant neoplasm of cervix: Secondary | ICD-10-CM | POA: Diagnosis not present

## 2019-11-30 DIAGNOSIS — N760 Acute vaginitis: Secondary | ICD-10-CM | POA: Diagnosis not present

## 2019-11-30 DIAGNOSIS — M545 Low back pain: Secondary | ICD-10-CM | POA: Diagnosis not present

## 2019-11-30 DIAGNOSIS — N951 Menopausal and female climacteric states: Secondary | ICD-10-CM | POA: Diagnosis not present

## 2019-12-10 DIAGNOSIS — D509 Iron deficiency anemia, unspecified: Secondary | ICD-10-CM | POA: Diagnosis not present

## 2019-12-10 DIAGNOSIS — K5730 Diverticulosis of large intestine without perforation or abscess without bleeding: Secondary | ICD-10-CM | POA: Diagnosis not present

## 2019-12-10 DIAGNOSIS — Z800 Family history of malignant neoplasm of digestive organs: Secondary | ICD-10-CM | POA: Diagnosis not present

## 2019-12-10 DIAGNOSIS — Z86010 Personal history of colonic polyps: Secondary | ICD-10-CM | POA: Diagnosis not present

## 2019-12-10 DIAGNOSIS — Z1211 Encounter for screening for malignant neoplasm of colon: Secondary | ICD-10-CM | POA: Diagnosis not present

## 2019-12-11 ENCOUNTER — Other Ambulatory Visit: Payer: Self-pay | Admitting: Internal Medicine

## 2019-12-11 DIAGNOSIS — I700 Atherosclerosis of aorta: Secondary | ICD-10-CM | POA: Diagnosis not present

## 2019-12-11 DIAGNOSIS — E785 Hyperlipidemia, unspecified: Secondary | ICD-10-CM | POA: Diagnosis not present

## 2019-12-11 DIAGNOSIS — G4700 Insomnia, unspecified: Secondary | ICD-10-CM | POA: Diagnosis not present

## 2019-12-11 DIAGNOSIS — K589 Irritable bowel syndrome without diarrhea: Secondary | ICD-10-CM | POA: Diagnosis not present

## 2019-12-11 DIAGNOSIS — Z23 Encounter for immunization: Secondary | ICD-10-CM | POA: Diagnosis not present

## 2019-12-15 DIAGNOSIS — Z23 Encounter for immunization: Secondary | ICD-10-CM | POA: Diagnosis not present

## 2019-12-24 DIAGNOSIS — L821 Other seborrheic keratosis: Secondary | ICD-10-CM | POA: Diagnosis not present

## 2019-12-24 DIAGNOSIS — D485 Neoplasm of uncertain behavior of skin: Secondary | ICD-10-CM | POA: Diagnosis not present

## 2019-12-24 DIAGNOSIS — L820 Inflamed seborrheic keratosis: Secondary | ICD-10-CM | POA: Diagnosis not present

## 2019-12-24 DIAGNOSIS — B079 Viral wart, unspecified: Secondary | ICD-10-CM | POA: Diagnosis not present

## 2019-12-29 ENCOUNTER — Ambulatory Visit
Admission: RE | Admit: 2019-12-29 | Discharge: 2019-12-29 | Disposition: A | Discharge status: Home / Self Care | Payer: Medicare Other | Source: Ambulatory Visit | Attending: Internal Medicine | Admitting: Internal Medicine

## 2019-12-29 DIAGNOSIS — E785 Hyperlipidemia, unspecified: Secondary | ICD-10-CM | POA: Diagnosis not present

## 2019-12-29 DIAGNOSIS — I700 Atherosclerosis of aorta: Secondary | ICD-10-CM | POA: Diagnosis not present

## 2019-12-30 ENCOUNTER — Ambulatory Visit: Payer: Medicare Other | Admitting: Podiatry

## 2020-01-06 ENCOUNTER — Other Ambulatory Visit: Payer: Self-pay

## 2020-01-06 ENCOUNTER — Ambulatory Visit (INDEPENDENT_AMBULATORY_CARE_PROVIDER_SITE_OTHER): Payer: Medicare Other | Admitting: Podiatry

## 2020-01-06 DIAGNOSIS — L600 Ingrowing nail: Secondary | ICD-10-CM | POA: Diagnosis not present

## 2020-01-06 NOTE — Progress Notes (Signed)
   Subjective: Patient presents today for evaluation of pain to the medial border right great toe. Patient is concerned for possible ingrown nail.  Patient has a history of partial permanent nail avulsions to the left great toe medial and lateral border.  Right great toe lateral border.  Bilateral fifth toes.  She is concerned for an ingrown toenail now to the medial border of the right great toe.  Pain is been present for a few months now.  Patient presents today for further treatment and evaluation.  Past Medical History:  Diagnosis Date  . Arthritis   . Multiple environmental allergies     Objective:  General: Well developed, nourished, in no acute distress, alert and oriented x3   Dermatology: Skin is warm, dry and supple bilateral.  Medial border right great toe appears to be erythematous with evidence of an ingrowing nail. Pain on palpation noted to the border of the nail fold. The remaining nails appear unremarkable at this time. There are no open sores, lesions.  Vascular: Dorsalis Pedis artery and Posterior Tibial artery pedal pulses palpable. No lower extremity edema noted.   Neruologic: Grossly intact via light touch bilateral.  Musculoskeletal: Muscular strength within normal limits in all groups bilateral. Normal range of motion noted to all pedal and ankle joints.   Assesement: #1 Paronychia with ingrowing nail medial border right great toe #2 Pain in toe #3 Incurvated nail  Plan of Care:  1. Patient evaluated.  2. Discussed treatment alternatives and plan of care. Explained nail avulsion procedure and post procedure course to patient. 3. Patient opted for permanent partial nail avulsion of the medial border right great toe.  4. Prior to procedure, local anesthesia infiltration utilized using 3 ml of a 50:50 mixture of 2% plain lidocaine and 0.5% plain marcaine in a normal hallux block fashion and a betadine prep performed.  5. Partial permanent nail avulsion with  chemical matrixectomy performed using [id number] applications of phenol followed by alcohol flush.  6. Light dressing applied. 7.  Silvadene cream provided for the patient apply daily  8.  Return to clinic 2 weeks.  Edrick Kins, DPM [facility]  Dr. Edrick Kins, [address]                                        [place], [place] [address]                Office [phone]  Fax [phone]

## 2020-01-06 NOTE — Patient Instructions (Signed)
Place 1/4 cup of epsom salts in a quart of warm tap water.  Submerge your foot or feet in the solution and soak for 20 minutes.  This soak should be done twice a day.  Next, remove your foot or feet from solution, blot dry the affected area. Apply ointment and cover if instructed by your doctor.   IF YOUR SKIN BECOMES IRRITATED WHILE USING THESE INSTRUCTIONS, IT IS OKAY TO SWITCH TO  WHITE VINEGAR AND WATER.  As another alternative soak, you may use antibacterial soap and water.  Monitor for any signs/symptoms of infection. Call the office immediately if any occur or go directly to the emergency room. Call with any questions/concerns.  Ingrown Toenail An ingrown toenail occurs when the corner or sides of a toenail grow into the surrounding skin. This causes discomfort and pain. The big toe is most commonly affected, but any of the toes can be affected. If an ingrown toenail is not treated, it can become infected. What are the causes? This condition may be caused by:  Wearing shoes that are too small or tight.  An injury, such as stubbing your toe or having your toe stepped on.  Improper cutting or care of your toenails.  Having nail or foot abnormalities that were present from birth (congenital abnormalities), such as having a nail that is too big for your toe. What increases the risk? The following factors may make you more likely to develop ingrown toenails:  Age. Nails tend to get thicker with age, so ingrown nails are more common among older people.  Cutting your toenails incorrectly, such as cutting them very short or cutting them unevenly. An ingrown toenail is more likely to get infected if you have:  Diabetes.  Blood flow (circulation) problems. What are the signs or symptoms? Symptoms of an ingrown toenail may include:  Pain, soreness, or tenderness.  Redness.  Swelling.  Hardening of the skin that surrounds the toenail. Signs that an ingrown toenail may be infected  include:  Fluid or pus.  Symptoms that get worse instead of better. How is this diagnosed? An ingrown toenail may be diagnosed based on your medical history, your symptoms, and a physical exam. If you have fluid or blood coming from your toenail, a sample may be collected to test for the specific type of bacteria that is causing the infection. How is this treated? Treatment depends on how severe your ingrown toenail is. You may be able to care for your toenail at home.  If you have an infection, you may be prescribed antibiotic medicines.  If you have fluid or pus draining from your toenail, your health care provider may drain it.  If you have trouble walking, you may be given crutches to use.  If you have a severe or infected ingrown toenail, you may need a procedure to remove part or all of the nail. Follow these instructions at home: Foot care   Do not pick at your toenail or try to remove it yourself.  Soak your foot in warm, soapy water. Do this for 20 minutes, 3 times a day, or as often as told by your health care provider. This helps to keep your toe clean and keep your skin soft.  Wear shoes that fit well and are not too tight. Your health care provider may recommend that you wear open-toed shoes while you heal.  Trim your toenails regularly and carefully. Cut your toenails straight across to prevent injury to the skin at the   corners of the toenail. Do not cut your nails in a curved shape.  Keep your feet clean and dry to help prevent infection. Medicines  Take over-the-counter and prescription medicines only as told by your health care provider.  If you were prescribed an antibiotic, take it as told by your health care provider. Do not stop taking the antibiotic even if you start to feel better. Activity  Return to your normal activities as told by your health care provider. Ask your health care provider what activities are safe for you.  Avoid activities that cause  pain. General instructions  If your health care provider told you to use crutches to help you move around, use them as instructed.  Keep all follow-up visits as told by your health care provider. This is important. Contact a health care provider if:  You have more redness, swelling, pain, or other symptoms that do not improve with treatment.  You have fluid, blood, or pus coming from your toenail. Get help right away if:  You have a red streak on your skin that starts at your foot and spreads up your leg.  You have a fever. Summary  An ingrown toenail occurs when the corner or sides of a toenail grow into the surrounding skin. This causes discomfort and pain. The big toe is most commonly affected, but any of the toes can be affected.  If an ingrown toenail is not treated, it can become infected.  Fluid or pus draining from your toenail is a sign of infection. Your health care provider may need to drain it. You may be given antibiotics to treat the infection.  Trimming your toenails regularly and properly can help you prevent an ingrown toenail. This information is not intended to replace advice given to you by your health care provider. Make sure you discuss any questions you have with your health care provider. Document Revised: 06/20/2018 Document Reviewed: 11/14/2016 Elsevier Patient Education  2020 [employer]  

## 2020-01-18 DIAGNOSIS — Z20822 Contact with and (suspected) exposure to covid-19: Secondary | ICD-10-CM | POA: Diagnosis not present

## 2020-02-01 ENCOUNTER — Ambulatory Visit: Payer: Medicare Other | Admitting: Podiatry

## 2020-02-08 DIAGNOSIS — D122 Benign neoplasm of ascending colon: Secondary | ICD-10-CM | POA: Diagnosis not present

## 2020-02-08 DIAGNOSIS — D509 Iron deficiency anemia, unspecified: Secondary | ICD-10-CM | POA: Diagnosis not present

## 2020-02-08 DIAGNOSIS — D125 Benign neoplasm of sigmoid colon: Secondary | ICD-10-CM | POA: Diagnosis not present

## 2020-02-08 DIAGNOSIS — K5730 Diverticulosis of large intestine without perforation or abscess without bleeding: Secondary | ICD-10-CM | POA: Diagnosis not present

## 2020-02-08 DIAGNOSIS — Z1211 Encounter for screening for malignant neoplasm of colon: Secondary | ICD-10-CM | POA: Diagnosis not present

## 2020-02-08 DIAGNOSIS — K31A11 Gastric intestinal metaplasia without dysplasia, involving the antrum: Secondary | ICD-10-CM | POA: Diagnosis not present

## 2020-02-08 DIAGNOSIS — K635 Polyp of colon: Secondary | ICD-10-CM | POA: Diagnosis not present

## 2020-02-08 DIAGNOSIS — K209 Esophagitis, unspecified without bleeding: Secondary | ICD-10-CM | POA: Diagnosis not present

## 2020-02-08 DIAGNOSIS — D123 Benign neoplasm of transverse colon: Secondary | ICD-10-CM | POA: Diagnosis not present

## 2020-02-08 DIAGNOSIS — K3189 Other diseases of stomach and duodenum: Secondary | ICD-10-CM | POA: Diagnosis not present

## 2020-02-08 DIAGNOSIS — K208 Other esophagitis without bleeding: Secondary | ICD-10-CM | POA: Diagnosis not present

## 2020-02-08 DIAGNOSIS — K2950 Unspecified chronic gastritis without bleeding: Secondary | ICD-10-CM | POA: Diagnosis not present

## 2020-03-03 DIAGNOSIS — E785 Hyperlipidemia, unspecified: Secondary | ICD-10-CM | POA: Diagnosis not present

## 2020-04-13 DIAGNOSIS — E785 Hyperlipidemia, unspecified: Secondary | ICD-10-CM | POA: Diagnosis not present

## 2020-04-13 DIAGNOSIS — Z86010 Personal history of colonic polyps: Secondary | ICD-10-CM | POA: Diagnosis not present

## 2020-04-13 DIAGNOSIS — Z1331 Encounter for screening for depression: Secondary | ICD-10-CM | POA: Diagnosis not present

## 2020-04-13 DIAGNOSIS — Z0000 Encounter for general adult medical examination without abnormal findings: Secondary | ICD-10-CM | POA: Diagnosis not present

## 2020-04-13 DIAGNOSIS — I700 Atherosclerosis of aorta: Secondary | ICD-10-CM | POA: Diagnosis not present

## 2020-04-13 DIAGNOSIS — G729 Myopathy, unspecified: Secondary | ICD-10-CM | POA: Diagnosis not present

## 2020-04-26 DIAGNOSIS — S83242A Other tear of medial meniscus, current injury, left knee, initial encounter: Secondary | ICD-10-CM | POA: Diagnosis not present

## 2020-05-24 DIAGNOSIS — S83242D Other tear of medial meniscus, current injury, left knee, subsequent encounter: Secondary | ICD-10-CM | POA: Diagnosis not present

## 2020-06-01 DIAGNOSIS — J3089 Other allergic rhinitis: Secondary | ICD-10-CM | POA: Diagnosis not present

## 2020-06-01 DIAGNOSIS — H1045 Other chronic allergic conjunctivitis: Secondary | ICD-10-CM | POA: Diagnosis not present

## 2020-06-01 DIAGNOSIS — H6122 Impacted cerumen, left ear: Secondary | ICD-10-CM | POA: Diagnosis not present

## 2020-06-06 DIAGNOSIS — M25562 Pain in left knee: Secondary | ICD-10-CM | POA: Diagnosis not present

## 2020-06-21 DIAGNOSIS — M6281 Muscle weakness (generalized): Secondary | ICD-10-CM | POA: Diagnosis not present

## 2020-06-21 DIAGNOSIS — S76192D Other specified injury of left quadriceps muscle, fascia and tendon, subsequent encounter: Secondary | ICD-10-CM | POA: Diagnosis not present

## 2020-06-21 DIAGNOSIS — M25562 Pain in left knee: Secondary | ICD-10-CM | POA: Diagnosis not present

## 2020-06-28 DIAGNOSIS — M6281 Muscle weakness (generalized): Secondary | ICD-10-CM | POA: Diagnosis not present

## 2020-06-28 DIAGNOSIS — S76192D Other specified injury of left quadriceps muscle, fascia and tendon, subsequent encounter: Secondary | ICD-10-CM | POA: Diagnosis not present

## 2020-06-28 DIAGNOSIS — M25562 Pain in left knee: Secondary | ICD-10-CM | POA: Diagnosis not present

## 2020-06-29 DIAGNOSIS — M6281 Muscle weakness (generalized): Secondary | ICD-10-CM | POA: Diagnosis not present

## 2020-06-29 DIAGNOSIS — S76192D Other specified injury of left quadriceps muscle, fascia and tendon, subsequent encounter: Secondary | ICD-10-CM | POA: Diagnosis not present

## 2020-06-29 DIAGNOSIS — M25562 Pain in left knee: Secondary | ICD-10-CM | POA: Diagnosis not present

## 2020-06-30 DIAGNOSIS — M25552 Pain in left hip: Secondary | ICD-10-CM | POA: Diagnosis not present

## 2020-06-30 DIAGNOSIS — M545 Low back pain, unspecified: Secondary | ICD-10-CM | POA: Diagnosis not present

## 2020-07-06 DIAGNOSIS — N3946 Mixed incontinence: Secondary | ICD-10-CM | POA: Diagnosis not present

## 2020-07-06 DIAGNOSIS — M25562 Pain in left knee: Secondary | ICD-10-CM | POA: Diagnosis not present

## 2020-07-06 DIAGNOSIS — R351 Nocturia: Secondary | ICD-10-CM | POA: Diagnosis not present

## 2020-07-06 DIAGNOSIS — M6281 Muscle weakness (generalized): Secondary | ICD-10-CM | POA: Diagnosis not present

## 2020-07-06 DIAGNOSIS — S76192D Other specified injury of left quadriceps muscle, fascia and tendon, subsequent encounter: Secondary | ICD-10-CM | POA: Diagnosis not present

## 2020-07-08 DIAGNOSIS — M6281 Muscle weakness (generalized): Secondary | ICD-10-CM | POA: Diagnosis not present

## 2020-07-08 DIAGNOSIS — M25562 Pain in left knee: Secondary | ICD-10-CM | POA: Diagnosis not present

## 2020-07-08 DIAGNOSIS — S76192D Other specified injury of left quadriceps muscle, fascia and tendon, subsequent encounter: Secondary | ICD-10-CM | POA: Diagnosis not present

## 2020-07-11 DIAGNOSIS — M25562 Pain in left knee: Secondary | ICD-10-CM | POA: Diagnosis not present

## 2020-07-11 DIAGNOSIS — S76192D Other specified injury of left quadriceps muscle, fascia and tendon, subsequent encounter: Secondary | ICD-10-CM | POA: Diagnosis not present

## 2020-07-11 DIAGNOSIS — M6281 Muscle weakness (generalized): Secondary | ICD-10-CM | POA: Diagnosis not present

## 2020-07-14 DIAGNOSIS — M25562 Pain in left knee: Secondary | ICD-10-CM | POA: Diagnosis not present

## 2020-07-14 DIAGNOSIS — S76192D Other specified injury of left quadriceps muscle, fascia and tendon, subsequent encounter: Secondary | ICD-10-CM | POA: Diagnosis not present

## 2020-07-14 DIAGNOSIS — M6281 Muscle weakness (generalized): Secondary | ICD-10-CM | POA: Diagnosis not present

## 2020-07-15 DIAGNOSIS — M545 Low back pain, unspecified: Secondary | ICD-10-CM | POA: Diagnosis not present

## 2020-07-18 DIAGNOSIS — M25562 Pain in left knee: Secondary | ICD-10-CM | POA: Diagnosis not present

## 2020-07-18 DIAGNOSIS — S76192D Other specified injury of left quadriceps muscle, fascia and tendon, subsequent encounter: Secondary | ICD-10-CM | POA: Diagnosis not present

## 2020-07-18 DIAGNOSIS — M6281 Muscle weakness (generalized): Secondary | ICD-10-CM | POA: Diagnosis not present

## 2020-07-25 ENCOUNTER — Other Ambulatory Visit: Payer: Self-pay | Admitting: Sports Medicine

## 2020-07-25 DIAGNOSIS — G8929 Other chronic pain: Secondary | ICD-10-CM

## 2020-07-25 DIAGNOSIS — M545 Low back pain, unspecified: Secondary | ICD-10-CM | POA: Diagnosis not present

## 2020-07-29 ENCOUNTER — Other Ambulatory Visit: Payer: Self-pay

## 2020-07-29 ENCOUNTER — Ambulatory Visit
Admission: RE | Admit: 2020-07-29 | Discharge: 2020-07-29 | Disposition: A | Discharge status: Home / Self Care | Payer: Medicare Other | Source: Ambulatory Visit | Attending: Sports Medicine | Admitting: Sports Medicine

## 2020-07-29 DIAGNOSIS — G8929 Other chronic pain: Secondary | ICD-10-CM

## 2020-07-29 DIAGNOSIS — M47817 Spondylosis without myelopathy or radiculopathy, lumbosacral region: Secondary | ICD-10-CM | POA: Diagnosis not present

## 2020-07-29 DIAGNOSIS — M545 Low back pain, unspecified: Secondary | ICD-10-CM

## 2020-07-29 MED ORDER — METHYLPREDNISOLONE ACETATE 40 MG/ML INJ SUSP (RADIOLOG
80.0000 mg | Freq: Once | INTRAMUSCULAR | Status: AC
  Administered 2020-07-29: 80 mg via EPIDURAL

## 2020-07-29 MED ORDER — IOPAMIDOL (ISOVUE-M 200) INJECTION 41%
1.0000 mL | Freq: Once | INTRAMUSCULAR | Status: AC
  Administered 2020-07-29: 1 mL via EPIDURAL

## 2020-07-29 NOTE — Discharge Instructions (Signed)
Post Procedure Spinal Discharge Instruction Sheet  1. You may resume a regular diet and any medications that you routinely take (including pain medications).  2. No driving day of procedure.  3. Light activity throughout the rest of the day.  Do not do any strenuous work, exercise, bending or lifting.  The day following the procedure, you can resume normal physical activity but you should refrain from exercising or physical therapy for at least three days thereafter.   Common Side Effects:   Headaches- take your usual medications as directed by your physician.  Increase your fluid intake.  Caffeinated beverages may be helpful.  Lie flat in bed until your headache resolves.   Restlessness or inability to sleep- you may have trouble sleeping for the next few days.  Ask your referring physician if you need any medication for sleep.   Facial flushing or redness- should subside within a few days.   Increased pain- a temporary increase in pain a day or two following your procedure is not unusual.  Take your pain medication as prescribed by your referring physician.   Leg cramps  Please contact our office at [phone] for the following symptoms:  Fever greater than 100 degrees.  Headaches unresolved with medication after 2-3 days.  Increased swelling, pain, or redness at injection site.   Thank you for visiting  [facility] today.  

## 2020-08-18 DIAGNOSIS — Z803 Family history of malignant neoplasm of breast: Secondary | ICD-10-CM | POA: Diagnosis not present

## 2020-08-18 DIAGNOSIS — Z1231 Encounter for screening mammogram for malignant neoplasm of breast: Secondary | ICD-10-CM | POA: Diagnosis not present

## 2020-08-23 ENCOUNTER — Ambulatory Visit (INDEPENDENT_AMBULATORY_CARE_PROVIDER_SITE_OTHER): Payer: Medicare Other | Admitting: Sports Medicine

## 2020-08-23 ENCOUNTER — Ambulatory Visit: Payer: Self-pay

## 2020-08-23 ENCOUNTER — Other Ambulatory Visit: Payer: Self-pay

## 2020-08-23 VITALS — BP 120/84 | Ht 64.0 in | Wt 154.0 lb

## 2020-08-23 DIAGNOSIS — M722 Plantar fascial fibromatosis: Secondary | ICD-10-CM

## 2020-08-23 DIAGNOSIS — M79672 Pain in left foot: Secondary | ICD-10-CM | POA: Diagnosis not present

## 2020-08-23 NOTE — Progress Notes (Addendum)
    SUBJECTIVE:   CHIEF COMPLAINT / HPI:   Left Foot Pain April Simmons is a very pleasant 75y/o female presents with new onset of left foot pain for one week. No trauma or fall. She has had a pain like this in the past in her other foot. She has not noticed any skin rashes or swelling. It is tender and painful to walk on it. It has responded to ice, tylenol, and rest. She feels like it is plantar fascitis. She also has a metatarsal strap she has been wearing which she feels helps. She has not been doing any exercises or stretches.  PERTINENT  PMH / PSH: HTN, HLD  OBJECTIVE:   BP 120/84   Ht 5\' 4"  (1.626 m)   Wt 154 lb (69.9 kg)   BMI 26.43 kg/m   No flowsheet data found.  MSK: Examination of the left foot is significant for TTP at the origin of the plantar fascia radiating into the plantar medial portion of the midfoot. No swelling, erythema, or rash. She has full active and passive pain free range of motion of the ankle. No other tenderness. She walks with a limp. She has full strength without pain 5/5 in dorsiflexion, plantarflexion, inversion, and eversion. Gross sensation intact. +2 dorsalis pedis pulse.  Limited MSK U/S: Left Foot Significant for thickening of the plantar fascia at the origin on the calcaneus. No achilles thickening and not TTP on U/S exam. No evidence of retrocalcaneal bursitis Impression: Plantar fascitis  ASSESSMENT/PLAN:   Plantar fasciitis of left foot History, presentation, exam, and ultrasound findings all consistent with plantar fascitis. - Cont with ice, Tylenol, and metatarsal strap as well as wearing orthotics. - Avoid wearing sandals or going barefoot - Given HEP stretches and exercises - F/u in 3 weeks     Nuala Alpha, DO PGY-4, Sports Medicine Fellow [place]  Patient seen and evaluated with the sports medicine fellow.  I agree with the above plan of care.  Treatment as above for Planter fasciitis and follow-up in 3 to  4 weeks if symptoms persist.

## 2020-08-23 NOTE — Assessment & Plan Note (Signed)
History, presentation, exam, and ultrasound findings all consistent with plantar fascitis. - Cont with ice, Tylenol, and metatarsal strap as well as wearing orthotics. - Avoid wearing sandals or going barefoot - Given HEP stretches and exercises - F/u in 3 weeks

## 2020-08-23 NOTE — Patient Instructions (Signed)
It was great to meet you today! Thank you for letting me participate in your care!  Today, we discussed your left foot pain which is due to plantar fascitis. Please take Tylenol Extra Strength scheduled two to three times per day and use Biofreeze as needed for pain. Please do the exercises we gave you 5 times per week. Also, continue to ice it 20 minutes at a time 3-4 times per day. Be sure NOT to walk barefoot or wear sandals and always wear your orthotics.  We will see you back in 3 weeks to ensure you are doing better.  Be well, Harolyn Rutherford, DO PGY-4, Sports Medicine Fellow [place]

## 2020-09-02 ENCOUNTER — Other Ambulatory Visit: Payer: Self-pay | Admitting: Sports Medicine

## 2020-09-02 DIAGNOSIS — G8929 Other chronic pain: Secondary | ICD-10-CM

## 2020-09-05 DIAGNOSIS — H524 Presbyopia: Secondary | ICD-10-CM | POA: Diagnosis not present

## 2020-09-05 DIAGNOSIS — H2513 Age-related nuclear cataract, bilateral: Secondary | ICD-10-CM | POA: Diagnosis not present

## 2020-09-06 ENCOUNTER — Ambulatory Visit
Admission: RE | Admit: 2020-09-06 | Discharge: 2020-09-06 | Disposition: A | Discharge status: Home / Self Care | Payer: Medicare Other | Source: Ambulatory Visit | Attending: Sports Medicine | Admitting: Sports Medicine

## 2020-09-06 ENCOUNTER — Other Ambulatory Visit: Payer: Self-pay

## 2020-09-06 ENCOUNTER — Inpatient Hospital Stay: Admission: RE | Admit: 2020-09-06 | Payer: Medicare Other | Source: Ambulatory Visit

## 2020-09-06 DIAGNOSIS — G8929 Other chronic pain: Secondary | ICD-10-CM

## 2020-09-06 DIAGNOSIS — M47817 Spondylosis without myelopathy or radiculopathy, lumbosacral region: Secondary | ICD-10-CM | POA: Diagnosis not present

## 2020-09-06 MED ORDER — IOPAMIDOL (ISOVUE-M 200) INJECTION 41%
1.0000 mL | Freq: Once | INTRAMUSCULAR | Status: AC
  Administered 2020-09-06: 1 mL via EPIDURAL

## 2020-09-06 MED ORDER — METHYLPREDNISOLONE ACETATE 40 MG/ML INJ SUSP (RADIOLOG
80.0000 mg | Freq: Once | INTRAMUSCULAR | Status: AC
  Administered 2020-09-06: 80 mg via EPIDURAL

## 2020-09-06 NOTE — Discharge Instructions (Signed)

## 2020-09-13 ENCOUNTER — Ambulatory Visit (INDEPENDENT_AMBULATORY_CARE_PROVIDER_SITE_OTHER): Payer: Medicare Other | Admitting: Sports Medicine

## 2020-09-13 ENCOUNTER — Other Ambulatory Visit: Payer: Self-pay

## 2020-09-13 DIAGNOSIS — M722 Plantar fascial fibromatosis: Secondary | ICD-10-CM | POA: Diagnosis not present

## 2020-09-13 NOTE — Progress Notes (Signed)
    SUBJECTIVE:   CHIEF COMPLAINT / HPI: f/u plantar fasciitis  75 yo woman presents for f/u 3 weeks after being seen for plantar fasciitis. Patient had had PF before and had had this pain in her left heel for 1 week. She was given a HEP and ice recommendations, which she did. Today she feels completely back to normal and has no residual pain.  PERTINENT  PMH / PSH: plantar fasciitis  OBJECTIVE:   BP 128/80   Ht 5\' 4"  (1.626 m)   Wt 155 lb (70.3 kg)   BMI 26.61 kg/m   Nursing note and vitals reviewed GEN: age-appropriate WW, resting comfortably in chair, NAD, WNWD, alert and at baseline Left foot: no erythema, edema, or other skin abnormalities. 2+ PD, achilles DTR intact. No TTP along plantar foot. Psych: Pleasant and appropriate   ASSESSMENT/PLAN:   Plantar fasciitis of left foot Pain completely resolved with HEP and icing. Likely quick resolution as patient initiated care in acute period and followed through with recommendations. If plantar fasciitis returns, recommend starting HEP and ice again. Follow up in future as needed.     Gladys Damme, MD [place]     Patient seen and evaluated with the resident.  I agree with the above plan of care.  Patient is doing very well.  I will discharge her from my care to follow-up as needed.

## 2020-09-13 NOTE — Assessment & Plan Note (Signed)
Pain completely resolved with HEP and icing. Likely quick resolution as patient initiated care in acute period and followed through with recommendations. If plantar fasciitis returns, recommend starting HEP and ice again. Follow up in future as needed.

## 2020-09-14 DIAGNOSIS — H2512 Age-related nuclear cataract, left eye: Secondary | ICD-10-CM | POA: Diagnosis not present

## 2020-09-14 DIAGNOSIS — H25812 Combined forms of age-related cataract, left eye: Secondary | ICD-10-CM | POA: Diagnosis not present

## 2020-09-19 DIAGNOSIS — Z23 Encounter for immunization: Secondary | ICD-10-CM | POA: Diagnosis not present

## 2020-09-27 ENCOUNTER — Other Ambulatory Visit: Payer: Self-pay | Admitting: Sports Medicine

## 2020-09-27 DIAGNOSIS — G8929 Other chronic pain: Secondary | ICD-10-CM

## 2020-10-12 ENCOUNTER — Ambulatory Visit: Payer: Medicare Other | Admitting: Audiologist

## 2020-10-18 DIAGNOSIS — E785 Hyperlipidemia, unspecified: Secondary | ICD-10-CM | POA: Diagnosis not present

## 2020-10-20 ENCOUNTER — Ambulatory Visit
Admission: RE | Admit: 2020-10-20 | Discharge: 2020-10-20 | Disposition: A | Discharge status: Home / Self Care | Payer: Medicare Other | Source: Ambulatory Visit | Attending: Sports Medicine | Admitting: Sports Medicine

## 2020-10-20 ENCOUNTER — Other Ambulatory Visit: Payer: Medicare Other

## 2020-10-20 ENCOUNTER — Other Ambulatory Visit: Payer: Self-pay

## 2020-10-20 DIAGNOSIS — M545 Low back pain, unspecified: Secondary | ICD-10-CM

## 2020-10-20 DIAGNOSIS — M47817 Spondylosis without myelopathy or radiculopathy, lumbosacral region: Secondary | ICD-10-CM | POA: Diagnosis not present

## 2020-10-20 DIAGNOSIS — G8929 Other chronic pain: Secondary | ICD-10-CM

## 2020-10-20 MED ORDER — METHYLPREDNISOLONE ACETATE 40 MG/ML INJ SUSP (RADIOLOG
80.0000 mg | Freq: Once | INTRAMUSCULAR | Status: AC
  Administered 2020-10-20: 80 mg via EPIDURAL

## 2020-10-20 MED ORDER — IOPAMIDOL (ISOVUE-M 200) INJECTION 41%
1.0000 mL | Freq: Once | INTRAMUSCULAR | Status: AC
  Administered 2020-10-20: 1 mL via EPIDURAL

## 2020-10-20 NOTE — Discharge Instructions (Signed)

## 2020-10-25 DIAGNOSIS — G729 Myopathy, unspecified: Secondary | ICD-10-CM | POA: Diagnosis not present

## 2020-10-25 DIAGNOSIS — E785 Hyperlipidemia, unspecified: Secondary | ICD-10-CM | POA: Diagnosis not present

## 2020-10-25 DIAGNOSIS — I700 Atherosclerosis of aorta: Secondary | ICD-10-CM | POA: Diagnosis not present

## 2020-10-26 DIAGNOSIS — H25811 Combined forms of age-related cataract, right eye: Secondary | ICD-10-CM | POA: Diagnosis not present

## 2020-10-26 DIAGNOSIS — H2511 Age-related nuclear cataract, right eye: Secondary | ICD-10-CM | POA: Diagnosis not present

## 2020-10-27 DIAGNOSIS — M545 Low back pain, unspecified: Secondary | ICD-10-CM | POA: Diagnosis not present

## 2020-11-01 ENCOUNTER — Ambulatory Visit: Payer: Medicare Other | Attending: Internal Medicine | Admitting: Audiologist

## 2020-11-01 ENCOUNTER — Other Ambulatory Visit: Payer: Self-pay

## 2020-11-01 DIAGNOSIS — H903 Sensorineural hearing loss, bilateral: Secondary | ICD-10-CM | POA: Diagnosis not present

## 2020-11-01 NOTE — Procedures (Signed)
  Outpatient Audiology and [place],   [phone]  AUDIOLOGICAL  EVALUATION  NAME: April Simmons     DOB:   03-14-1945      MRN: [id number]                                                                                     DATE: 11/01/2020     REFERENT: Michael Boston, MD STATUS: Outpatient DIAGNOSIS: Sensorineural Hearing Loss     History: [name] was seen for an audiological evaluation.  [name] is receiving a hearing evaluation due to concerns for difficulty. [name] has difficulty hearing in background noise, crowds, and when people are at a distance. This difficulty began gradually. No pain or pressure reported in either ear.  No tinnitus present in either ear. [name] has no history of noise exposure. She had a hearing test with [facility] in 2021 shows mild to moderate sensorineural hearing loss.   Medical history for negative for any risk factor for hearing loss. No other relevant case history reported.    Evaluation:  Otoscopy showed a clear view of the tympanic membranes, bilaterally Tympanometry results were consistent with normal middle ear pressure bilaterally   Audiometric testing was completed using conventional audiometry with insert transducer. Speech Recognition Thresholds were consistent with pure tone averages. Word Recognition was excellent at an elevated level and fair at a conversational. Pure tone thresholds show normal sloping to mild to moderate sensorineural hearing loss in both ears. Test results are consistent with age related changes.   Results:  Test results were reviewed with [name] and she was counseled on the nature and degree of hers hearing loss. She was provided with several copies of her audiogram that illustrate her degree of hearing loss in both ears. She has mild hearing loss is in the high frequencies preventing [name] from hearing high frequency consonants such as /s/ /sh/ /f/ /t/ and /th/. These  sounds help differentiate the words she hears. Without these sounds, speech is muffled and unclear unless someone is face to face within 5 feet without a mask. She says she really only perceives difficulty hearing when in crowds, and she is rarely out anymore due to [name]. I recommend just monitoring hearing loss for now and making sure to use good communication strategies. If she perceives difficulty and feels she needs occasional support, recommend an over the counter hearing aid device.    Recommendations: 1.   Annual audiometric testing recommended to monitor hearing loss progressions.  2.   Recommend trying an over the counter hearing aid due to mild degree of hearing loss if patient feels she needs help hearing.     [name]  Audiologist, Au.D., CCC-A 11/01/2020  11:33 AM  Cc: Michael Boston, MD

## 2020-11-15 DIAGNOSIS — M5416 Radiculopathy, lumbar region: Secondary | ICD-10-CM | POA: Diagnosis not present

## 2020-11-16 ENCOUNTER — Other Ambulatory Visit: Payer: Self-pay | Admitting: Neurological Surgery

## 2020-11-16 DIAGNOSIS — M5416 Radiculopathy, lumbar region: Secondary | ICD-10-CM

## 2020-11-22 ENCOUNTER — Ambulatory Visit
Admission: RE | Admit: 2020-11-22 | Discharge: 2020-11-22 | Disposition: A | Discharge status: Home / Self Care | Payer: Medicare Other | Source: Ambulatory Visit | Attending: Neurological Surgery | Admitting: Neurological Surgery

## 2020-11-22 ENCOUNTER — Other Ambulatory Visit: Payer: Self-pay

## 2020-11-22 DIAGNOSIS — M5416 Radiculopathy, lumbar region: Secondary | ICD-10-CM

## 2020-11-22 DIAGNOSIS — G8929 Other chronic pain: Secondary | ICD-10-CM | POA: Diagnosis not present

## 2020-11-22 DIAGNOSIS — M545 Low back pain, unspecified: Secondary | ICD-10-CM | POA: Diagnosis not present

## 2020-11-22 DIAGNOSIS — M47816 Spondylosis without myelopathy or radiculopathy, lumbar region: Secondary | ICD-10-CM | POA: Diagnosis not present

## 2020-11-22 DIAGNOSIS — M2578 Osteophyte, vertebrae: Secondary | ICD-10-CM | POA: Diagnosis not present

## 2020-11-22 MED ORDER — IOPAMIDOL (ISOVUE-M 200) INJECTION 41%
15.0000 mL | Freq: Once | INTRAMUSCULAR | Status: AC
  Administered 2020-11-22: 15 mL via INTRATHECAL

## 2020-11-22 MED ORDER — DIAZEPAM 5 MG PO TABS
5.0000 mg | ORAL_TABLET | Freq: Once | ORAL | Status: AC
  Administered 2020-11-22: 5 mg via ORAL

## 2020-11-22 MED ORDER — MEPERIDINE HCL 50 MG/ML IJ SOLN
50.0000 mg | Freq: Once | INTRAMUSCULAR | Status: DC | PRN
Start: 2020-11-22 — End: 2020-11-23

## 2020-11-22 MED ORDER — ONDANSETRON HCL 4 MG/2ML IJ SOLN: 4.0000 mg | Freq: Once | INTRAMUSCULAR | Status: DC | PRN

## 2020-11-22 NOTE — Discharge Instructions (Signed)

## 2020-11-29 DIAGNOSIS — Z79890 Hormone replacement therapy (postmenopausal): Secondary | ICD-10-CM | POA: Diagnosis not present

## 2020-11-29 DIAGNOSIS — Z01411 Encounter for gynecological examination (general) (routine) with abnormal findings: Secondary | ICD-10-CM | POA: Diagnosis not present

## 2020-11-29 DIAGNOSIS — N951 Menopausal and female climacteric states: Secondary | ICD-10-CM | POA: Diagnosis not present

## 2020-11-29 DIAGNOSIS — M5416 Radiculopathy, lumbar region: Secondary | ICD-10-CM | POA: Diagnosis not present

## 2020-11-29 DIAGNOSIS — N952 Postmenopausal atrophic vaginitis: Secondary | ICD-10-CM | POA: Diagnosis not present

## 2020-11-29 DIAGNOSIS — Z01419 Encounter for gynecological examination (general) (routine) without abnormal findings: Secondary | ICD-10-CM | POA: Diagnosis not present

## 2020-11-29 DIAGNOSIS — Z124 Encounter for screening for malignant neoplasm of cervix: Secondary | ICD-10-CM | POA: Diagnosis not present

## 2020-11-29 DIAGNOSIS — Z6826 Body mass index (BMI) 26.0-26.9, adult: Secondary | ICD-10-CM | POA: Diagnosis not present

## 2020-11-29 DIAGNOSIS — N760 Acute vaginitis: Secondary | ICD-10-CM | POA: Diagnosis not present

## 2020-12-06 DIAGNOSIS — M6281 Muscle weakness (generalized): Secondary | ICD-10-CM | POA: Diagnosis not present

## 2020-12-06 DIAGNOSIS — M545 Low back pain, unspecified: Secondary | ICD-10-CM | POA: Diagnosis not present

## 2020-12-06 DIAGNOSIS — M5416 Radiculopathy, lumbar region: Secondary | ICD-10-CM | POA: Diagnosis not present

## 2020-12-08 DIAGNOSIS — M5136 Other intervertebral disc degeneration, lumbar region: Secondary | ICD-10-CM | POA: Diagnosis not present

## 2020-12-12 DIAGNOSIS — L720 Epidermal cyst: Secondary | ICD-10-CM | POA: Diagnosis not present

## 2020-12-12 DIAGNOSIS — L821 Other seborrheic keratosis: Secondary | ICD-10-CM | POA: Diagnosis not present

## 2020-12-12 DIAGNOSIS — B078 Other viral warts: Secondary | ICD-10-CM | POA: Diagnosis not present

## 2020-12-14 DIAGNOSIS — M545 Low back pain, unspecified: Secondary | ICD-10-CM | POA: Diagnosis not present

## 2020-12-14 DIAGNOSIS — M5416 Radiculopathy, lumbar region: Secondary | ICD-10-CM | POA: Diagnosis not present

## 2020-12-14 DIAGNOSIS — M6281 Muscle weakness (generalized): Secondary | ICD-10-CM | POA: Diagnosis not present

## 2020-12-15 ENCOUNTER — Other Ambulatory Visit: Payer: Self-pay | Admitting: Neurosurgery

## 2020-12-15 DIAGNOSIS — M47816 Spondylosis without myelopathy or radiculopathy, lumbar region: Secondary | ICD-10-CM

## 2020-12-16 DIAGNOSIS — M5416 Radiculopathy, lumbar region: Secondary | ICD-10-CM | POA: Diagnosis not present

## 2020-12-16 DIAGNOSIS — M6281 Muscle weakness (generalized): Secondary | ICD-10-CM | POA: Diagnosis not present

## 2020-12-16 DIAGNOSIS — M545 Low back pain, unspecified: Secondary | ICD-10-CM | POA: Diagnosis not present

## 2020-12-19 DIAGNOSIS — M6281 Muscle weakness (generalized): Secondary | ICD-10-CM | POA: Diagnosis not present

## 2020-12-19 DIAGNOSIS — M545 Low back pain, unspecified: Secondary | ICD-10-CM | POA: Diagnosis not present

## 2020-12-19 DIAGNOSIS — M5416 Radiculopathy, lumbar region: Secondary | ICD-10-CM | POA: Diagnosis not present

## 2020-12-22 DIAGNOSIS — M5416 Radiculopathy, lumbar region: Secondary | ICD-10-CM | POA: Diagnosis not present

## 2020-12-22 DIAGNOSIS — M545 Low back pain, unspecified: Secondary | ICD-10-CM | POA: Diagnosis not present

## 2020-12-22 DIAGNOSIS — M6281 Muscle weakness (generalized): Secondary | ICD-10-CM | POA: Diagnosis not present

## 2020-12-26 DIAGNOSIS — M5416 Radiculopathy, lumbar region: Secondary | ICD-10-CM | POA: Diagnosis not present

## 2020-12-26 DIAGNOSIS — M6281 Muscle weakness (generalized): Secondary | ICD-10-CM | POA: Diagnosis not present

## 2020-12-26 DIAGNOSIS — M545 Low back pain, unspecified: Secondary | ICD-10-CM | POA: Diagnosis not present

## 2020-12-29 DIAGNOSIS — M545 Low back pain, unspecified: Secondary | ICD-10-CM | POA: Diagnosis not present

## 2020-12-29 DIAGNOSIS — M6281 Muscle weakness (generalized): Secondary | ICD-10-CM | POA: Diagnosis not present

## 2020-12-29 DIAGNOSIS — M5416 Radiculopathy, lumbar region: Secondary | ICD-10-CM | POA: Diagnosis not present

## 2021-01-02 DIAGNOSIS — Z23 Encounter for immunization: Secondary | ICD-10-CM | POA: Diagnosis not present

## 2021-01-05 DIAGNOSIS — Z6826 Body mass index (BMI) 26.0-26.9, adult: Secondary | ICD-10-CM | POA: Diagnosis not present

## 2021-01-05 DIAGNOSIS — M47816 Spondylosis without myelopathy or radiculopathy, lumbar region: Secondary | ICD-10-CM | POA: Diagnosis not present

## 2021-01-05 DIAGNOSIS — M5136 Other intervertebral disc degeneration, lumbar region: Secondary | ICD-10-CM | POA: Diagnosis not present

## 2021-01-05 DIAGNOSIS — M5416 Radiculopathy, lumbar region: Secondary | ICD-10-CM | POA: Diagnosis not present

## 2021-01-24 DIAGNOSIS — D509 Iron deficiency anemia, unspecified: Secondary | ICD-10-CM | POA: Diagnosis not present

## 2021-01-24 DIAGNOSIS — Z1211 Encounter for screening for malignant neoplasm of colon: Secondary | ICD-10-CM | POA: Diagnosis not present

## 2021-01-24 DIAGNOSIS — K5730 Diverticulosis of large intestine without perforation or abscess without bleeding: Secondary | ICD-10-CM | POA: Diagnosis not present

## 2021-01-24 DIAGNOSIS — K5904 Chronic idiopathic constipation: Secondary | ICD-10-CM | POA: Diagnosis not present

## 2021-01-24 DIAGNOSIS — Z800 Family history of malignant neoplasm of digestive organs: Secondary | ICD-10-CM | POA: Diagnosis not present

## 2021-01-27 ENCOUNTER — Other Ambulatory Visit: Payer: Self-pay | Admitting: Sports Medicine

## 2021-01-27 DIAGNOSIS — M47816 Spondylosis without myelopathy or radiculopathy, lumbar region: Secondary | ICD-10-CM

## 2021-01-30 ENCOUNTER — Ambulatory Visit
Admission: RE | Admit: 2021-01-30 | Discharge: 2021-01-30 | Disposition: A | Discharge status: Home / Self Care | Payer: Medicare Other | Source: Ambulatory Visit | Attending: Neurosurgery | Admitting: Neurosurgery

## 2021-01-30 ENCOUNTER — Other Ambulatory Visit: Payer: Self-pay | Admitting: Sports Medicine

## 2021-01-30 ENCOUNTER — Other Ambulatory Visit: Payer: Self-pay

## 2021-01-30 DIAGNOSIS — M47816 Spondylosis without myelopathy or radiculopathy, lumbar region: Secondary | ICD-10-CM

## 2021-01-30 DIAGNOSIS — M545 Low back pain, unspecified: Secondary | ICD-10-CM | POA: Diagnosis not present

## 2021-01-30 MED ORDER — METHYLPREDNISOLONE ACETATE 40 MG/ML INJ SUSP (RADIOLOG
120.0000 mg | Freq: Once | INTRAMUSCULAR | Status: AC
  Administered 2021-01-30: 120 mg via INTRA_ARTICULAR

## 2021-01-30 MED ORDER — IOPAMIDOL (ISOVUE-M 200) INJECTION 41%
1.0000 mL | Freq: Once | INTRAMUSCULAR | Status: AC
  Administered 2021-01-30: 1 mL via INTRA_ARTICULAR

## 2021-01-30 NOTE — Discharge Instructions (Signed)

## 2021-02-08 DIAGNOSIS — Z6826 Body mass index (BMI) 26.0-26.9, adult: Secondary | ICD-10-CM | POA: Diagnosis not present

## 2021-02-08 DIAGNOSIS — M47816 Spondylosis without myelopathy or radiculopathy, lumbar region: Secondary | ICD-10-CM | POA: Diagnosis not present

## 2021-02-08 DIAGNOSIS — M5416 Radiculopathy, lumbar region: Secondary | ICD-10-CM | POA: Diagnosis not present

## 2021-02-08 DIAGNOSIS — R030 Elevated blood-pressure reading, without diagnosis of hypertension: Secondary | ICD-10-CM | POA: Diagnosis not present

## 2021-02-21 DIAGNOSIS — M545 Low back pain, unspecified: Secondary | ICD-10-CM | POA: Diagnosis not present

## 2021-03-21 DIAGNOSIS — M5136 Other intervertebral disc degeneration, lumbar region: Secondary | ICD-10-CM | POA: Diagnosis not present

## 2021-03-29 DIAGNOSIS — L821 Other seborrheic keratosis: Secondary | ICD-10-CM | POA: Diagnosis not present

## 2021-03-29 DIAGNOSIS — D0352 Melanoma in situ of breast (skin) (soft tissue): Secondary | ICD-10-CM | POA: Diagnosis not present

## 2021-03-29 DIAGNOSIS — D485 Neoplasm of uncertain behavior of skin: Secondary | ICD-10-CM | POA: Diagnosis not present

## 2021-03-29 DIAGNOSIS — D0359 Melanoma in situ of other part of trunk: Secondary | ICD-10-CM | POA: Diagnosis not present

## 2021-04-03 DIAGNOSIS — H00015 Hordeolum externum left lower eyelid: Secondary | ICD-10-CM | POA: Diagnosis not present

## 2021-04-12 DIAGNOSIS — K635 Polyp of colon: Secondary | ICD-10-CM | POA: Diagnosis not present

## 2021-04-12 DIAGNOSIS — D125 Benign neoplasm of sigmoid colon: Secondary | ICD-10-CM | POA: Diagnosis not present

## 2021-04-12 DIAGNOSIS — Z800 Family history of malignant neoplasm of digestive organs: Secondary | ICD-10-CM | POA: Diagnosis not present

## 2021-04-12 DIAGNOSIS — Z1211 Encounter for screening for malignant neoplasm of colon: Secondary | ICD-10-CM | POA: Diagnosis not present

## 2021-04-12 DIAGNOSIS — Z86010 Personal history of colonic polyps: Secondary | ICD-10-CM | POA: Diagnosis not present

## 2021-04-12 DIAGNOSIS — D120 Benign neoplasm of cecum: Secondary | ICD-10-CM | POA: Diagnosis not present

## 2021-04-12 DIAGNOSIS — D122 Benign neoplasm of ascending colon: Secondary | ICD-10-CM | POA: Diagnosis not present

## 2021-04-13 DIAGNOSIS — D0352 Melanoma in situ of breast (skin) (soft tissue): Secondary | ICD-10-CM | POA: Diagnosis not present

## 2021-04-13 DIAGNOSIS — L988 Other specified disorders of the skin and subcutaneous tissue: Secondary | ICD-10-CM | POA: Diagnosis not present

## 2021-04-27 DIAGNOSIS — D1801 Hemangioma of skin and subcutaneous tissue: Secondary | ICD-10-CM | POA: Diagnosis not present

## 2021-04-27 DIAGNOSIS — L812 Freckles: Secondary | ICD-10-CM | POA: Diagnosis not present

## 2021-04-27 DIAGNOSIS — Z85820 Personal history of malignant melanoma of skin: Secondary | ICD-10-CM | POA: Diagnosis not present

## 2021-04-27 DIAGNOSIS — D2261 Melanocytic nevi of right upper limb, including shoulder: Secondary | ICD-10-CM | POA: Diagnosis not present

## 2021-04-27 DIAGNOSIS — D2371 Other benign neoplasm of skin of right lower limb, including hip: Secondary | ICD-10-CM | POA: Diagnosis not present

## 2021-04-27 DIAGNOSIS — D2262 Melanocytic nevi of left upper limb, including shoulder: Secondary | ICD-10-CM | POA: Diagnosis not present

## 2021-04-27 DIAGNOSIS — D225 Melanocytic nevi of trunk: Secondary | ICD-10-CM | POA: Diagnosis not present

## 2021-04-27 DIAGNOSIS — L821 Other seborrheic keratosis: Secondary | ICD-10-CM | POA: Diagnosis not present

## 2021-05-02 DIAGNOSIS — M533 Sacrococcygeal disorders, not elsewhere classified: Secondary | ICD-10-CM | POA: Diagnosis not present

## 2021-05-02 DIAGNOSIS — M47816 Spondylosis without myelopathy or radiculopathy, lumbar region: Secondary | ICD-10-CM | POA: Diagnosis not present

## 2021-05-08 ENCOUNTER — Other Ambulatory Visit: Payer: Self-pay | Admitting: Medical

## 2021-05-08 DIAGNOSIS — M533 Sacrococcygeal disorders, not elsewhere classified: Secondary | ICD-10-CM

## 2021-05-10 ENCOUNTER — Other Ambulatory Visit: Payer: Self-pay | Admitting: Neurosurgery

## 2021-05-10 ENCOUNTER — Other Ambulatory Visit: Payer: Self-pay | Admitting: Medical

## 2021-05-10 DIAGNOSIS — M533 Sacrococcygeal disorders, not elsewhere classified: Secondary | ICD-10-CM

## 2021-05-11 DIAGNOSIS — D649 Anemia, unspecified: Secondary | ICD-10-CM | POA: Diagnosis not present

## 2021-05-12 DIAGNOSIS — I700 Atherosclerosis of aorta: Secondary | ICD-10-CM | POA: Diagnosis not present

## 2021-05-12 DIAGNOSIS — E785 Hyperlipidemia, unspecified: Secondary | ICD-10-CM | POA: Diagnosis not present

## 2021-05-16 DIAGNOSIS — Z1339 Encounter for screening examination for other mental health and behavioral disorders: Secondary | ICD-10-CM | POA: Diagnosis not present

## 2021-05-16 DIAGNOSIS — D696 Thrombocytopenia, unspecified: Secondary | ICD-10-CM | POA: Diagnosis not present

## 2021-05-16 DIAGNOSIS — I700 Atherosclerosis of aorta: Secondary | ICD-10-CM | POA: Diagnosis not present

## 2021-05-16 DIAGNOSIS — E785 Hyperlipidemia, unspecified: Secondary | ICD-10-CM | POA: Diagnosis not present

## 2021-05-16 DIAGNOSIS — N1831 Chronic kidney disease, stage 3a: Secondary | ICD-10-CM | POA: Diagnosis not present

## 2021-05-16 DIAGNOSIS — G729 Myopathy, unspecified: Secondary | ICD-10-CM | POA: Diagnosis not present

## 2021-05-16 DIAGNOSIS — Z86010 Personal history of colonic polyps: Secondary | ICD-10-CM | POA: Diagnosis not present

## 2021-05-16 DIAGNOSIS — N3946 Mixed incontinence: Secondary | ICD-10-CM | POA: Diagnosis not present

## 2021-05-16 DIAGNOSIS — Z0000 Encounter for general adult medical examination without abnormal findings: Secondary | ICD-10-CM | POA: Diagnosis not present

## 2021-05-16 DIAGNOSIS — D649 Anemia, unspecified: Secondary | ICD-10-CM | POA: Diagnosis not present

## 2021-05-16 DIAGNOSIS — G4700 Insomnia, unspecified: Secondary | ICD-10-CM | POA: Diagnosis not present

## 2021-05-16 DIAGNOSIS — Z1331 Encounter for screening for depression: Secondary | ICD-10-CM | POA: Diagnosis not present

## 2021-05-17 ENCOUNTER — Ambulatory Visit
Admission: RE | Admit: 2021-05-17 | Discharge: 2021-05-17 | Disposition: A | Discharge status: Home / Self Care | Payer: Medicare Other | Source: Ambulatory Visit | Attending: Neurosurgery | Admitting: Neurosurgery

## 2021-05-17 DIAGNOSIS — M533 Sacrococcygeal disorders, not elsewhere classified: Secondary | ICD-10-CM

## 2021-05-17 DIAGNOSIS — M545 Low back pain, unspecified: Secondary | ICD-10-CM | POA: Diagnosis not present

## 2021-05-17 MED ORDER — METHYLPREDNISOLONE ACETATE 40 MG/ML INJ SUSP (RADIOLOG
80.0000 mg | Freq: Once | INTRAMUSCULAR | Status: AC
  Administered 2021-05-17: 80 mg via INTRA_ARTICULAR

## 2021-05-17 MED ORDER — IOPAMIDOL (ISOVUE-M 200) INJECTION 41%
1.0000 mL | Freq: Once | INTRAMUSCULAR | Status: AC
  Administered 2021-05-17: 1 mL via INTRA_ARTICULAR

## 2021-05-17 NOTE — Discharge Instructions (Signed)
Post Procedure Discharge Instruction Sheet ? ?You may resume a regular diet and any medications that you routinely take (including pain medications) unless otherwise noted by MD. ? ?No driving day of procedure. ? ?Light activity throughout the rest of the day.  Do not do any strenuous work, exercise, bending or lifting.  The day following the procedure, you can resume normal physical activity but you should refrain from exercising or physical therapy for at least three days thereafter. ? ?You may apply ice to the injection site, 20 minutes on, 20 minutes off, as needed. Do not apply ice directly to skin.  ? ? ?Common Side Effects: ? ?Headaches- take your usual medications as directed by your physician.  Increase your fluid intake.  Caffeinated beverages may be helpful.  Lie flat in bed until your headache resolves. ? ?Restlessness or inability to sleep- you may have trouble sleeping for the next few days.  Ask your referring physician if you need any medication for sleep. ? ?Facial flushing or redness- should subside within a few days. ? ?Increased pain- a temporary increase in pain a day or two following your procedure is not unusual.  Take your pain medication as prescribed by your referring physician. ? ?Leg cramps ? ?Please contact our office at [phone] for the following symptoms: ?Fever greater than 100 degrees. ?Headaches unresolved with medication after 2-3 days. ?Increased swelling, pain, or redness at injection site. ? ? ?Thank you for visiting [facility] today.   ?

## 2021-05-18 DIAGNOSIS — H6123 Impacted cerumen, bilateral: Secondary | ICD-10-CM | POA: Diagnosis not present

## 2021-05-22 DIAGNOSIS — Z23 Encounter for immunization: Secondary | ICD-10-CM | POA: Diagnosis not present

## 2021-05-30 DIAGNOSIS — H1045 Other chronic allergic conjunctivitis: Secondary | ICD-10-CM | POA: Diagnosis not present

## 2021-05-30 DIAGNOSIS — J3089 Other allergic rhinitis: Secondary | ICD-10-CM | POA: Diagnosis not present

## 2021-06-02 DIAGNOSIS — Z961 Presence of intraocular lens: Secondary | ICD-10-CM | POA: Diagnosis not present

## 2021-07-05 DIAGNOSIS — L720 Epidermal cyst: Secondary | ICD-10-CM | POA: Diagnosis not present

## 2021-07-05 DIAGNOSIS — L905 Scar conditions and fibrosis of skin: Secondary | ICD-10-CM | POA: Diagnosis not present

## 2021-07-05 DIAGNOSIS — L820 Inflamed seborrheic keratosis: Secondary | ICD-10-CM | POA: Diagnosis not present

## 2021-07-05 DIAGNOSIS — Z85820 Personal history of malignant melanoma of skin: Secondary | ICD-10-CM | POA: Diagnosis not present

## 2021-07-11 DIAGNOSIS — N3946 Mixed incontinence: Secondary | ICD-10-CM | POA: Diagnosis not present

## 2021-07-11 DIAGNOSIS — R351 Nocturia: Secondary | ICD-10-CM | POA: Diagnosis not present

## 2021-07-13 DIAGNOSIS — M533 Sacrococcygeal disorders, not elsewhere classified: Secondary | ICD-10-CM | POA: Diagnosis not present

## 2021-07-18 ENCOUNTER — Other Ambulatory Visit: Payer: Self-pay | Admitting: Neurosurgery

## 2021-07-18 DIAGNOSIS — M533 Sacrococcygeal disorders, not elsewhere classified: Secondary | ICD-10-CM

## 2021-07-25 DIAGNOSIS — M5416 Radiculopathy, lumbar region: Secondary | ICD-10-CM | POA: Diagnosis not present

## 2021-07-25 DIAGNOSIS — M533 Sacrococcygeal disorders, not elsewhere classified: Secondary | ICD-10-CM | POA: Diagnosis not present

## 2021-07-25 DIAGNOSIS — M6281 Muscle weakness (generalized): Secondary | ICD-10-CM | POA: Diagnosis not present

## 2021-07-27 ENCOUNTER — Ambulatory Visit
Admission: RE | Admit: 2021-07-27 | Discharge: 2021-07-27 | Disposition: A | Discharge status: Home / Self Care | Payer: Medicare Other | Source: Ambulatory Visit | Attending: Neurosurgery | Admitting: Neurosurgery

## 2021-07-27 DIAGNOSIS — M533 Sacrococcygeal disorders, not elsewhere classified: Secondary | ICD-10-CM

## 2021-07-27 MED ORDER — METHYLPREDNISOLONE ACETATE 40 MG/ML INJ SUSP (RADIOLOG
80.0000 mg | Freq: Once | INTRAMUSCULAR | Status: AC
  Administered 2021-07-27: 80 mg via INTRA_ARTICULAR

## 2021-07-27 MED ORDER — IOPAMIDOL (ISOVUE-M 200) INJECTION 41%
1.0000 mL | Freq: Once | INTRAMUSCULAR | Status: AC
  Administered 2021-07-27: 1 mL via INTRA_ARTICULAR

## 2021-08-01 DIAGNOSIS — M6281 Muscle weakness (generalized): Secondary | ICD-10-CM | POA: Diagnosis not present

## 2021-08-01 DIAGNOSIS — M5416 Radiculopathy, lumbar region: Secondary | ICD-10-CM | POA: Diagnosis not present

## 2021-08-01 DIAGNOSIS — M533 Sacrococcygeal disorders, not elsewhere classified: Secondary | ICD-10-CM | POA: Diagnosis not present

## 2021-08-09 DIAGNOSIS — M533 Sacrococcygeal disorders, not elsewhere classified: Secondary | ICD-10-CM | POA: Diagnosis not present

## 2021-08-09 DIAGNOSIS — M5416 Radiculopathy, lumbar region: Secondary | ICD-10-CM | POA: Diagnosis not present

## 2021-08-09 DIAGNOSIS — M6281 Muscle weakness (generalized): Secondary | ICD-10-CM | POA: Diagnosis not present

## 2021-08-15 DIAGNOSIS — M5416 Radiculopathy, lumbar region: Secondary | ICD-10-CM | POA: Diagnosis not present

## 2021-08-15 DIAGNOSIS — M6281 Muscle weakness (generalized): Secondary | ICD-10-CM | POA: Diagnosis not present

## 2021-08-15 DIAGNOSIS — M533 Sacrococcygeal disorders, not elsewhere classified: Secondary | ICD-10-CM | POA: Diagnosis not present

## 2021-08-22 DIAGNOSIS — M5416 Radiculopathy, lumbar region: Secondary | ICD-10-CM | POA: Diagnosis not present

## 2021-08-22 DIAGNOSIS — M533 Sacrococcygeal disorders, not elsewhere classified: Secondary | ICD-10-CM | POA: Diagnosis not present

## 2021-08-22 DIAGNOSIS — M6281 Muscle weakness (generalized): Secondary | ICD-10-CM | POA: Diagnosis not present

## 2021-08-24 DIAGNOSIS — Z1231 Encounter for screening mammogram for malignant neoplasm of breast: Secondary | ICD-10-CM | POA: Diagnosis not present

## 2021-08-29 DIAGNOSIS — M533 Sacrococcygeal disorders, not elsewhere classified: Secondary | ICD-10-CM | POA: Diagnosis not present

## 2021-08-29 DIAGNOSIS — M6281 Muscle weakness (generalized): Secondary | ICD-10-CM | POA: Diagnosis not present

## 2021-08-29 DIAGNOSIS — M5416 Radiculopathy, lumbar region: Secondary | ICD-10-CM | POA: Diagnosis not present

## 2021-08-30 DIAGNOSIS — M5416 Radiculopathy, lumbar region: Secondary | ICD-10-CM | POA: Diagnosis not present

## 2021-10-07 DIAGNOSIS — M47816 Spondylosis without myelopathy or radiculopathy, lumbar region: Secondary | ICD-10-CM | POA: Diagnosis not present

## 2021-10-11 DIAGNOSIS — M5136 Other intervertebral disc degeneration, lumbar region: Secondary | ICD-10-CM | POA: Diagnosis not present

## 2021-10-16 DIAGNOSIS — L812 Freckles: Secondary | ICD-10-CM | POA: Diagnosis not present

## 2021-10-16 DIAGNOSIS — L821 Other seborrheic keratosis: Secondary | ICD-10-CM | POA: Diagnosis not present

## 2021-10-16 DIAGNOSIS — Z85820 Personal history of malignant melanoma of skin: Secondary | ICD-10-CM | POA: Diagnosis not present

## 2021-10-16 DIAGNOSIS — D2261 Melanocytic nevi of right upper limb, including shoulder: Secondary | ICD-10-CM | POA: Diagnosis not present

## 2021-10-16 DIAGNOSIS — D2272 Melanocytic nevi of left lower limb, including hip: Secondary | ICD-10-CM | POA: Diagnosis not present

## 2021-10-16 DIAGNOSIS — L720 Epidermal cyst: Secondary | ICD-10-CM | POA: Diagnosis not present

## 2021-10-16 DIAGNOSIS — D2262 Melanocytic nevi of left upper limb, including shoulder: Secondary | ICD-10-CM | POA: Diagnosis not present

## 2021-10-16 DIAGNOSIS — D1801 Hemangioma of skin and subcutaneous tissue: Secondary | ICD-10-CM | POA: Diagnosis not present

## 2021-10-16 DIAGNOSIS — D225 Melanocytic nevi of trunk: Secondary | ICD-10-CM | POA: Diagnosis not present

## 2021-10-16 DIAGNOSIS — D2271 Melanocytic nevi of right lower limb, including hip: Secondary | ICD-10-CM | POA: Diagnosis not present

## 2021-11-14 DIAGNOSIS — M5416 Radiculopathy, lumbar region: Secondary | ICD-10-CM | POA: Diagnosis not present

## 2021-11-28 DIAGNOSIS — M5416 Radiculopathy, lumbar region: Secondary | ICD-10-CM | POA: Diagnosis not present

## 2021-12-01 DIAGNOSIS — Z0142 Encounter for cervical smear to confirm findings of recent normal smear following initial abnormal smear: Secondary | ICD-10-CM | POA: Diagnosis not present

## 2021-12-01 DIAGNOSIS — N760 Acute vaginitis: Secondary | ICD-10-CM | POA: Diagnosis not present

## 2021-12-01 DIAGNOSIS — Z90711 Acquired absence of uterus with remaining cervical stump: Secondary | ICD-10-CM | POA: Diagnosis not present

## 2021-12-01 DIAGNOSIS — Z01411 Encounter for gynecological examination (general) (routine) with abnormal findings: Secondary | ICD-10-CM | POA: Diagnosis not present

## 2021-12-01 DIAGNOSIS — Z01419 Encounter for gynecological examination (general) (routine) without abnormal findings: Secondary | ICD-10-CM | POA: Diagnosis not present

## 2021-12-01 DIAGNOSIS — L439 Lichen planus, unspecified: Secondary | ICD-10-CM | POA: Diagnosis not present

## 2021-12-01 DIAGNOSIS — Z124 Encounter for screening for malignant neoplasm of cervix: Secondary | ICD-10-CM | POA: Diagnosis not present

## 2021-12-11 DIAGNOSIS — M47896 Other spondylosis, lumbar region: Secondary | ICD-10-CM | POA: Diagnosis not present

## 2021-12-11 DIAGNOSIS — Z79899 Other long term (current) drug therapy: Secondary | ICD-10-CM | POA: Diagnosis not present

## 2021-12-11 DIAGNOSIS — M5416 Radiculopathy, lumbar region: Secondary | ICD-10-CM | POA: Diagnosis not present

## 2021-12-11 DIAGNOSIS — Z5181 Encounter for therapeutic drug level monitoring: Secondary | ICD-10-CM | POA: Diagnosis not present

## 2021-12-23 DIAGNOSIS — Z23 Encounter for immunization: Secondary | ICD-10-CM | POA: Diagnosis not present

## 2022-01-08 DIAGNOSIS — Z23 Encounter for immunization: Secondary | ICD-10-CM | POA: Diagnosis not present

## 2022-01-16 DIAGNOSIS — H6123 Impacted cerumen, bilateral: Secondary | ICD-10-CM | POA: Diagnosis not present

## 2022-03-19 DIAGNOSIS — M47896 Other spondylosis, lumbar region: Secondary | ICD-10-CM | POA: Diagnosis not present

## 2022-03-19 DIAGNOSIS — M5416 Radiculopathy, lumbar region: Secondary | ICD-10-CM | POA: Diagnosis not present

## 2022-03-21 DIAGNOSIS — M65322 Trigger finger, left index finger: Secondary | ICD-10-CM | POA: Diagnosis not present

## 2022-03-21 DIAGNOSIS — M79642 Pain in left hand: Secondary | ICD-10-CM | POA: Diagnosis not present

## 2022-04-24 DIAGNOSIS — L821 Other seborrheic keratosis: Secondary | ICD-10-CM | POA: Diagnosis not present

## 2022-04-24 DIAGNOSIS — D2272 Melanocytic nevi of left lower limb, including hip: Secondary | ICD-10-CM | POA: Diagnosis not present

## 2022-04-24 DIAGNOSIS — D225 Melanocytic nevi of trunk: Secondary | ICD-10-CM | POA: Diagnosis not present

## 2022-04-24 DIAGNOSIS — Z85820 Personal history of malignant melanoma of skin: Secondary | ICD-10-CM | POA: Diagnosis not present

## 2022-04-24 DIAGNOSIS — D2271 Melanocytic nevi of right lower limb, including hip: Secondary | ICD-10-CM | POA: Diagnosis not present

## 2022-04-24 DIAGNOSIS — L812 Freckles: Secondary | ICD-10-CM | POA: Diagnosis not present

## 2022-04-24 DIAGNOSIS — D2261 Melanocytic nevi of right upper limb, including shoulder: Secondary | ICD-10-CM | POA: Diagnosis not present

## 2022-04-24 DIAGNOSIS — D1801 Hemangioma of skin and subcutaneous tissue: Secondary | ICD-10-CM | POA: Diagnosis not present

## 2022-04-24 DIAGNOSIS — B078 Other viral warts: Secondary | ICD-10-CM | POA: Diagnosis not present

## 2022-04-24 DIAGNOSIS — D2371 Other benign neoplasm of skin of right lower limb, including hip: Secondary | ICD-10-CM | POA: Diagnosis not present

## 2022-04-24 DIAGNOSIS — D485 Neoplasm of uncertain behavior of skin: Secondary | ICD-10-CM | POA: Diagnosis not present

## 2022-05-02 DIAGNOSIS — M65322 Trigger finger, left index finger: Secondary | ICD-10-CM | POA: Diagnosis not present

## 2022-05-15 ENCOUNTER — Ambulatory Visit: Payer: Medicare Other | Attending: Audiologist | Admitting: Audiologist

## 2022-05-15 DIAGNOSIS — H903 Sensorineural hearing loss, bilateral: Secondary | ICD-10-CM | POA: Diagnosis present

## 2022-05-15 NOTE — Procedures (Signed)
  Outpatient Audiology and [place], [place]  [phone]  AUDIOLOGICAL  EVALUATION  NAME: April Simmons     DOB:   [birth year]      MRN: [id number]                                                                                     DATE: 05/15/2022     REFERENT: Michael Boston, MD STATUS: Outpatient DIAGNOSIS: Sensorineural Hearing Loss Bilateral    History: April Simmons was seen for an audiological evaluation. April Simmons was seen in 2022 by [facility] and diagnosed with a mild to moderate loss. She was advised to pursue hearing aids, and try over the counter aids if she wanted a more affordable option due to her mild degree of loss. April Simmons is using OTC hearing aids when needed. She feels her hearing is stable. She still only struggles in noise and when people are at a distance. She has had no changes in her health since her 2022 evaluation. No pain, pressure, dizziness, or tinnitus.   Evaluation:  Otoscopy showed a clear view of the tympanic membranes, bilaterally Tympanometry results were consistent with normal middle ear function, bilaterally   Audiometric testing was completed using conventional audiometry with supraural transducer. Speech Recognition Thresholds were 30dB in the right ear and 30dB in the left ear. Word Recognition was performed 40 dB SL, scored 100% in the right ear and 96% in the left ear. Pure tone thresholds show normal hearing sloping to a mild to moderate sensorineural hearing loss bilaterally. No decrease in hearing compared to 2022 thresholds.   Results:  The test results were reviewed with April Simmons. Her hearing loss is stable, there has been no progression. She still has a mild to moderate high frequency hearing loss in each ear. She is using over the counter hearing aids which are working well for the time.      Recommendations: 1.  Today's test showed hearing is stable since last test, two years ago.  Recommend April Simmons have  another hearing test to monitor the progression of her hearing loss in 2026.    37 minutes spent testing and counseling on results.   [name]  Audiologist, Au.D., CCC-A 05/15/2022  2:23 PM  Cc: Michael Boston, MD

## 2022-05-17 DIAGNOSIS — M25511 Pain in right shoulder: Secondary | ICD-10-CM | POA: Diagnosis not present

## 2022-05-17 DIAGNOSIS — M25562 Pain in left knee: Secondary | ICD-10-CM | POA: Diagnosis not present

## 2022-05-20 DIAGNOSIS — M25511 Pain in right shoulder: Secondary | ICD-10-CM | POA: Diagnosis not present

## 2022-05-20 DIAGNOSIS — M25562 Pain in left knee: Secondary | ICD-10-CM | POA: Diagnosis not present

## 2022-05-29 DIAGNOSIS — B078 Other viral warts: Secondary | ICD-10-CM | POA: Diagnosis not present

## 2022-05-31 DIAGNOSIS — H1045 Other chronic allergic conjunctivitis: Secondary | ICD-10-CM | POA: Diagnosis not present

## 2022-05-31 DIAGNOSIS — J3089 Other allergic rhinitis: Secondary | ICD-10-CM | POA: Diagnosis not present

## 2022-06-04 DIAGNOSIS — H43819 Vitreous degeneration, unspecified eye: Secondary | ICD-10-CM | POA: Diagnosis not present

## 2022-06-04 DIAGNOSIS — H43391 Other vitreous opacities, right eye: Secondary | ICD-10-CM | POA: Diagnosis not present

## 2022-06-04 DIAGNOSIS — H4311 Vitreous hemorrhage, right eye: Secondary | ICD-10-CM | POA: Diagnosis not present

## 2022-06-05 DIAGNOSIS — R7989 Other specified abnormal findings of blood chemistry: Secondary | ICD-10-CM | POA: Diagnosis not present

## 2022-06-05 DIAGNOSIS — E785 Hyperlipidemia, unspecified: Secondary | ICD-10-CM | POA: Diagnosis not present

## 2022-06-05 DIAGNOSIS — I700 Atherosclerosis of aorta: Secondary | ICD-10-CM | POA: Diagnosis not present

## 2022-06-05 DIAGNOSIS — D649 Anemia, unspecified: Secondary | ICD-10-CM | POA: Diagnosis not present

## 2022-06-05 DIAGNOSIS — D509 Iron deficiency anemia, unspecified: Secondary | ICD-10-CM | POA: Diagnosis not present

## 2022-06-13 IMAGING — XA Imaging study
2 series · 2 of 2 positions shown · non-contrast
Comparison: none

CLINICAL DATA: Lumbosacral spondylosis without myelopathy. Low back
and bilateral buttock/thigh pain. Only 30% relief after last L5-S1
epidural injection in [REDACTED].

[Series 2: ortho standard · 1 of 1 slices shown (1 of 2)]
[im 1/1]
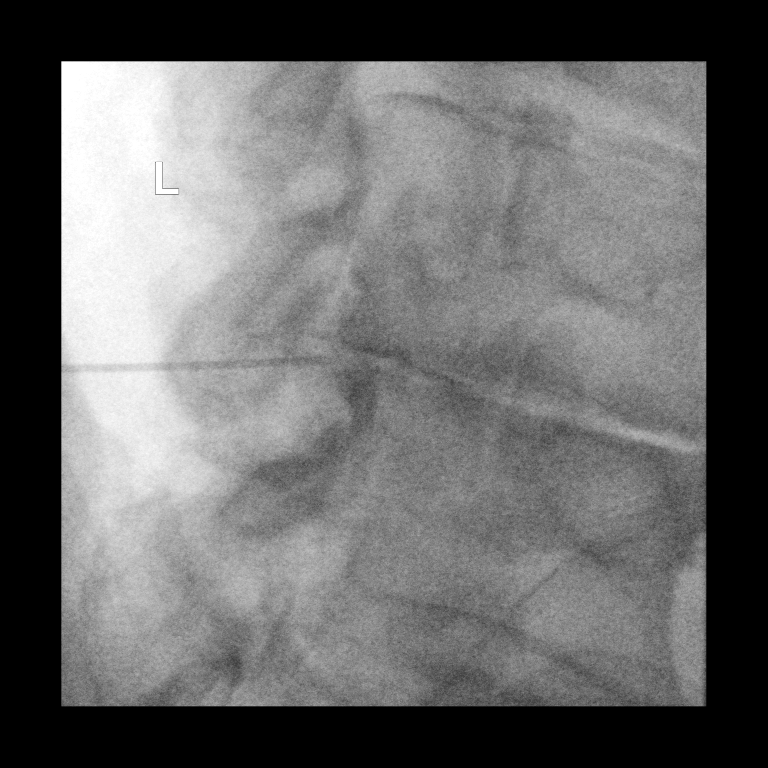

[Series 3: ortho standard · 1 of 1 slices shown (2 of 2)]
[im 1/1]
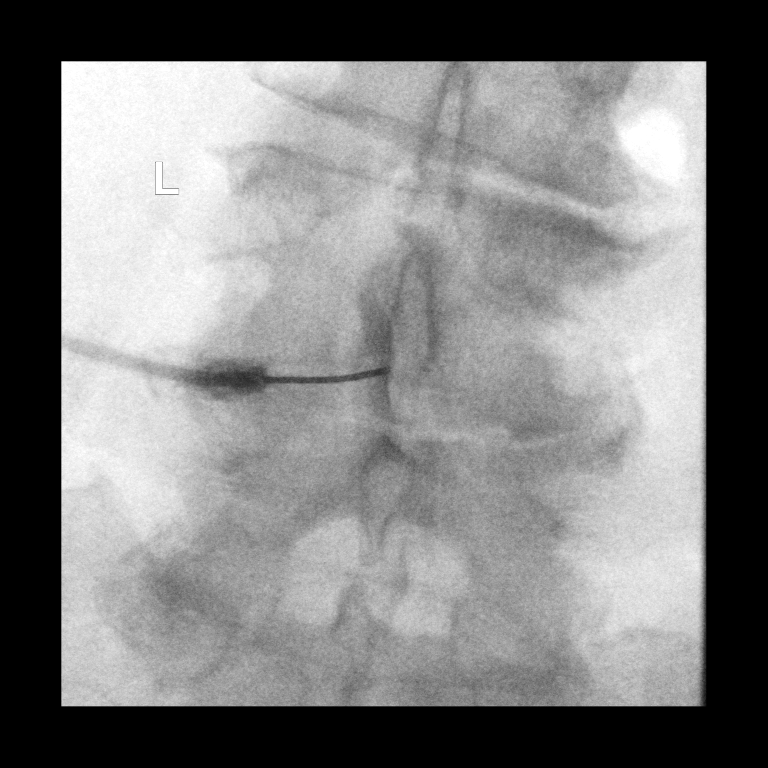

[2 of 2 positions shown; findings below may reference images not displayed]

FLUOROSCOPY TIME:  Radiation Exposure Index (as provided by the
fluoroscopic device): 7.5 mGy

Fluoroscopy Time:  47 seconds

Number of Acquired Images:  0

PROCEDURE:
The procedure, risks, benefits, and alternatives were explained to
the patient. Questions regarding the procedure were encouraged and
answered. The patient understands and consents to the procedure.

LUMBAR EPIDURAL INJECTION:

An interlaminar approach was performed on the left at L4-L5. The
overlying skin was cleansed and anesthetized. A 3.5 inch 20 gauge
epidural needle was advanced using loss-of-resistance technique.

DIAGNOSTIC EPIDURAL INJECTION:

Initial injection of Isovue-M 200 shows a good epidural pattern with
spread above and below the level of needle placement, primarily on
the right. The needle was repositioned and repeat injection
demonstrated more epidural spread on the left. No vascular
opacification is seen.

THERAPEUTIC EPIDURAL INJECTION:

80 mg of Depo-Medrol mixed with 3 mL of 1% lidocaine were instilled.
The procedure was well-tolerated, and the patient was discharged
thirty minutes following the injection in good condition.

COMPLICATIONS:
None immediate.
IMPRESSION: Technically successful interlaminar epidural injection on the left
at L4-L5.

## 2022-06-19 DIAGNOSIS — D890 Polyclonal hypergammaglobulinemia: Secondary | ICD-10-CM | POA: Diagnosis not present

## 2022-06-19 DIAGNOSIS — N1831 Chronic kidney disease, stage 3a: Secondary | ICD-10-CM | POA: Diagnosis not present

## 2022-06-19 DIAGNOSIS — N3946 Mixed incontinence: Secondary | ICD-10-CM | POA: Diagnosis not present

## 2022-06-19 DIAGNOSIS — D039 Melanoma in situ, unspecified: Secondary | ICD-10-CM | POA: Diagnosis not present

## 2022-06-19 DIAGNOSIS — Z1331 Encounter for screening for depression: Secondary | ICD-10-CM | POA: Diagnosis not present

## 2022-06-19 DIAGNOSIS — E785 Hyperlipidemia, unspecified: Secondary | ICD-10-CM | POA: Diagnosis not present

## 2022-06-19 DIAGNOSIS — I700 Atherosclerosis of aorta: Secondary | ICD-10-CM | POA: Diagnosis not present

## 2022-06-19 DIAGNOSIS — Z6823 Body mass index (BMI) 23.0-23.9, adult: Secondary | ICD-10-CM | POA: Diagnosis not present

## 2022-06-19 DIAGNOSIS — Z0000 Encounter for general adult medical examination without abnormal findings: Secondary | ICD-10-CM | POA: Diagnosis not present

## 2022-06-19 DIAGNOSIS — D649 Anemia, unspecified: Secondary | ICD-10-CM | POA: Diagnosis not present

## 2022-06-19 DIAGNOSIS — Z86010 Personal history of colonic polyps: Secondary | ICD-10-CM | POA: Diagnosis not present

## 2022-06-19 DIAGNOSIS — Z1339 Encounter for screening examination for other mental health and behavioral disorders: Secondary | ICD-10-CM | POA: Diagnosis not present

## 2022-06-20 DIAGNOSIS — H26493 Other secondary cataract, bilateral: Secondary | ICD-10-CM | POA: Diagnosis not present

## 2022-06-20 DIAGNOSIS — H43393 Other vitreous opacities, bilateral: Secondary | ICD-10-CM | POA: Diagnosis not present

## 2022-06-20 DIAGNOSIS — H4311 Vitreous hemorrhage, right eye: Secondary | ICD-10-CM | POA: Diagnosis not present

## 2022-06-20 DIAGNOSIS — H43813 Vitreous degeneration, bilateral: Secondary | ICD-10-CM | POA: Diagnosis not present

## 2022-06-20 DIAGNOSIS — H3561 Retinal hemorrhage, right eye: Secondary | ICD-10-CM | POA: Diagnosis not present

## 2022-07-05 DIAGNOSIS — H43391 Other vitreous opacities, right eye: Secondary | ICD-10-CM | POA: Diagnosis not present

## 2022-07-05 DIAGNOSIS — H4311 Vitreous hemorrhage, right eye: Secondary | ICD-10-CM | POA: Diagnosis not present

## 2022-07-05 DIAGNOSIS — H26491 Other secondary cataract, right eye: Secondary | ICD-10-CM | POA: Diagnosis not present

## 2022-07-18 DIAGNOSIS — M5136 Other intervertebral disc degeneration, lumbar region: Secondary | ICD-10-CM | POA: Diagnosis not present

## 2022-07-18 DIAGNOSIS — M47896 Other spondylosis, lumbar region: Secondary | ICD-10-CM | POA: Diagnosis not present

## 2022-07-18 DIAGNOSIS — M5416 Radiculopathy, lumbar region: Secondary | ICD-10-CM | POA: Diagnosis not present

## 2022-07-19 DIAGNOSIS — L218 Other seborrheic dermatitis: Secondary | ICD-10-CM | POA: Diagnosis not present

## 2022-07-19 DIAGNOSIS — L718 Other rosacea: Secondary | ICD-10-CM | POA: Diagnosis not present

## 2022-07-19 DIAGNOSIS — B078 Other viral warts: Secondary | ICD-10-CM | POA: Diagnosis not present

## 2022-07-19 DIAGNOSIS — L821 Other seborrheic keratosis: Secondary | ICD-10-CM | POA: Diagnosis not present

## 2022-07-19 DIAGNOSIS — J029 Acute pharyngitis, unspecified: Secondary | ICD-10-CM | POA: Diagnosis not present

## 2022-07-19 DIAGNOSIS — H6693 Otitis media, unspecified, bilateral: Secondary | ICD-10-CM | POA: Diagnosis not present

## 2022-07-19 DIAGNOSIS — H9203 Otalgia, bilateral: Secondary | ICD-10-CM | POA: Diagnosis not present

## 2022-07-25 DIAGNOSIS — H6122 Impacted cerumen, left ear: Secondary | ICD-10-CM | POA: Diagnosis not present

## 2022-07-31 DIAGNOSIS — R351 Nocturia: Secondary | ICD-10-CM | POA: Diagnosis not present

## 2022-07-31 DIAGNOSIS — N3946 Mixed incontinence: Secondary | ICD-10-CM | POA: Diagnosis not present

## 2022-08-01 DIAGNOSIS — H9201 Otalgia, right ear: Secondary | ICD-10-CM | POA: Diagnosis not present

## 2022-08-08 DIAGNOSIS — H26492 Other secondary cataract, left eye: Secondary | ICD-10-CM | POA: Diagnosis not present

## 2022-08-08 DIAGNOSIS — H43391 Other vitreous opacities, right eye: Secondary | ICD-10-CM | POA: Diagnosis not present

## 2022-08-08 DIAGNOSIS — H43392 Other vitreous opacities, left eye: Secondary | ICD-10-CM | POA: Diagnosis not present

## 2022-08-08 DIAGNOSIS — Z9889 Other specified postprocedural states: Secondary | ICD-10-CM | POA: Diagnosis not present

## 2022-08-08 DIAGNOSIS — H26491 Other secondary cataract, right eye: Secondary | ICD-10-CM | POA: Diagnosis not present

## 2022-08-24 ENCOUNTER — Other Ambulatory Visit: Payer: Self-pay | Admitting: Orthopedic Surgery

## 2022-08-24 DIAGNOSIS — R2232 Localized swelling, mass and lump, left upper limb: Secondary | ICD-10-CM

## 2022-08-24 DIAGNOSIS — M67442 Ganglion, left hand: Secondary | ICD-10-CM | POA: Diagnosis not present

## 2022-08-24 DIAGNOSIS — M65322 Trigger finger, left index finger: Secondary | ICD-10-CM | POA: Diagnosis not present

## 2022-09-03 DIAGNOSIS — Z1231 Encounter for screening mammogram for malignant neoplasm of breast: Secondary | ICD-10-CM | POA: Diagnosis not present

## 2022-09-05 DIAGNOSIS — B078 Other viral warts: Secondary | ICD-10-CM | POA: Diagnosis not present

## 2022-09-24 DIAGNOSIS — H43393 Other vitreous opacities, bilateral: Secondary | ICD-10-CM | POA: Diagnosis not present

## 2022-09-24 DIAGNOSIS — H43392 Other vitreous opacities, left eye: Secondary | ICD-10-CM | POA: Diagnosis not present

## 2022-09-24 DIAGNOSIS — H26492 Other secondary cataract, left eye: Secondary | ICD-10-CM | POA: Diagnosis not present

## 2022-09-25 ENCOUNTER — Ambulatory Visit
Admission: RE | Admit: 2022-09-25 | Discharge: 2022-09-25 | Disposition: A | Discharge status: Home / Self Care | Payer: Medicare Other | Source: Ambulatory Visit | Attending: Orthopedic Surgery | Admitting: Orthopedic Surgery

## 2022-09-25 DIAGNOSIS — M79645 Pain in left finger(s): Secondary | ICD-10-CM | POA: Diagnosis not present

## 2022-09-25 DIAGNOSIS — R2232 Localized swelling, mass and lump, left upper limb: Secondary | ICD-10-CM

## 2022-10-01 DIAGNOSIS — M65322 Trigger finger, left index finger: Secondary | ICD-10-CM | POA: Diagnosis not present

## 2022-11-13 DIAGNOSIS — M5416 Radiculopathy, lumbar region: Secondary | ICD-10-CM | POA: Diagnosis not present

## 2022-11-13 DIAGNOSIS — M5136 Other intervertebral disc degeneration, lumbar region: Secondary | ICD-10-CM | POA: Diagnosis not present

## 2022-11-13 DIAGNOSIS — M47816 Spondylosis without myelopathy or radiculopathy, lumbar region: Secondary | ICD-10-CM | POA: Diagnosis not present

## 2022-11-26 ENCOUNTER — Other Ambulatory Visit: Payer: Self-pay | Admitting: Orthopedic Surgery

## 2022-11-26 DIAGNOSIS — M65322 Trigger finger, left index finger: Secondary | ICD-10-CM | POA: Diagnosis not present

## 2022-12-15 DIAGNOSIS — Z23 Encounter for immunization: Secondary | ICD-10-CM | POA: Diagnosis not present

## 2022-12-27 DIAGNOSIS — Z23 Encounter for immunization: Secondary | ICD-10-CM | POA: Diagnosis not present

## 2022-12-31 ENCOUNTER — Encounter (HOSPITAL_BASED_OUTPATIENT_CLINIC_OR_DEPARTMENT_OTHER): Payer: Self-pay | Admitting: Orthopedic Surgery

## 2023-01-08 ENCOUNTER — Ambulatory Visit (HOSPITAL_BASED_OUTPATIENT_CLINIC_OR_DEPARTMENT_OTHER): Admission: RE | Admit: 2023-01-08 | Payer: Medicare Other | Source: Home / Self Care | Admitting: Orthopedic Surgery

## 2023-01-08 DIAGNOSIS — Z01818 Encounter for other preprocedural examination: Secondary | ICD-10-CM

## 2023-01-08 SURGERY — RELEASE, A1 PULLEY, FOR TRIGGER FINGER
Anesthesia: Choice | Site: Finger | Laterality: Left

## 2023-01-18 DIAGNOSIS — H6123 Impacted cerumen, bilateral: Secondary | ICD-10-CM | POA: Diagnosis not present

## 2023-01-29 DIAGNOSIS — M7551 Bursitis of right shoulder: Secondary | ICD-10-CM | POA: Diagnosis not present

## 2023-01-29 DIAGNOSIS — M7581 Other shoulder lesions, right shoulder: Secondary | ICD-10-CM | POA: Diagnosis not present

## 2023-02-14 DIAGNOSIS — H1045 Other chronic allergic conjunctivitis: Secondary | ICD-10-CM | POA: Diagnosis not present

## 2023-02-14 DIAGNOSIS — J3089 Other allergic rhinitis: Secondary | ICD-10-CM | POA: Diagnosis not present

## 2023-02-18 ENCOUNTER — Other Ambulatory Visit: Payer: Self-pay | Admitting: Orthopedic Surgery

## 2023-02-28 DIAGNOSIS — E2839 Other primary ovarian failure: Secondary | ICD-10-CM | POA: Diagnosis not present

## 2023-02-28 DIAGNOSIS — K5190 Ulcerative colitis, unspecified, without complications: Secondary | ICD-10-CM | POA: Diagnosis not present

## 2023-03-11 ENCOUNTER — Other Ambulatory Visit: Payer: Self-pay

## 2023-03-11 ENCOUNTER — Encounter (HOSPITAL_BASED_OUTPATIENT_CLINIC_OR_DEPARTMENT_OTHER): Payer: Self-pay | Admitting: Orthopedic Surgery

## 2023-03-12 DIAGNOSIS — L900 Lichen sclerosus et atrophicus: Secondary | ICD-10-CM | POA: Diagnosis not present

## 2023-03-12 DIAGNOSIS — N760 Acute vaginitis: Secondary | ICD-10-CM | POA: Diagnosis not present

## 2023-03-12 DIAGNOSIS — Z1331 Encounter for screening for depression: Secondary | ICD-10-CM | POA: Diagnosis not present

## 2023-03-12 DIAGNOSIS — Z124 Encounter for screening for malignant neoplasm of cervix: Secondary | ICD-10-CM | POA: Diagnosis not present

## 2023-03-12 DIAGNOSIS — N952 Postmenopausal atrophic vaginitis: Secondary | ICD-10-CM | POA: Diagnosis not present

## 2023-03-18 DIAGNOSIS — M5416 Radiculopathy, lumbar region: Secondary | ICD-10-CM | POA: Diagnosis not present

## 2023-03-18 DIAGNOSIS — M47896 Other spondylosis, lumbar region: Secondary | ICD-10-CM | POA: Diagnosis not present

## 2023-03-18 DIAGNOSIS — M51362 Other intervertebral disc degeneration, lumbar region with discogenic back pain and lower extremity pain: Secondary | ICD-10-CM | POA: Diagnosis not present

## 2023-03-18 DIAGNOSIS — Z5181 Encounter for therapeutic drug level monitoring: Secondary | ICD-10-CM | POA: Diagnosis not present

## 2023-03-18 DIAGNOSIS — Z79899 Other long term (current) drug therapy: Secondary | ICD-10-CM | POA: Diagnosis not present

## 2023-03-19 ENCOUNTER — Encounter (HOSPITAL_BASED_OUTPATIENT_CLINIC_OR_DEPARTMENT_OTHER): Payer: Self-pay | Admitting: Orthopedic Surgery

## 2023-03-19 ENCOUNTER — Encounter (HOSPITAL_BASED_OUTPATIENT_CLINIC_OR_DEPARTMENT_OTHER)
Admission: RE | Disposition: A | Discharge status: Home / Self Care | Payer: Self-pay | Source: Home / Self Care | Attending: Orthopedic Surgery

## 2023-03-19 ENCOUNTER — Ambulatory Visit (HOSPITAL_BASED_OUTPATIENT_CLINIC_OR_DEPARTMENT_OTHER): Payer: Self-pay | Admitting: Anesthesiology

## 2023-03-19 ENCOUNTER — Other Ambulatory Visit: Payer: Self-pay

## 2023-03-19 ENCOUNTER — Ambulatory Visit (HOSPITAL_BASED_OUTPATIENT_CLINIC_OR_DEPARTMENT_OTHER)
Admission: RE | Admit: 2023-03-19 | Discharge: 2023-03-19 | Disposition: A | Discharge status: Home / Self Care | Payer: Medicare Other | Attending: Orthopedic Surgery | Admitting: Orthopedic Surgery

## 2023-03-19 DIAGNOSIS — Z79899 Other long term (current) drug therapy: Secondary | ICD-10-CM | POA: Insufficient documentation

## 2023-03-19 DIAGNOSIS — Z01818 Encounter for other preprocedural examination: Secondary | ICD-10-CM

## 2023-03-19 DIAGNOSIS — Z87891 Personal history of nicotine dependence: Secondary | ICD-10-CM | POA: Insufficient documentation

## 2023-03-19 DIAGNOSIS — M65322 Trigger finger, left index finger: Secondary | ICD-10-CM | POA: Insufficient documentation

## 2023-03-19 DIAGNOSIS — I10 Essential (primary) hypertension: Secondary | ICD-10-CM | POA: Insufficient documentation

## 2023-03-19 HISTORY — DX: Anemia, unspecified: D64.9

## 2023-03-19 HISTORY — PX: TRIGGER FINGER RELEASE: SHX641

## 2023-03-19 HISTORY — DX: Other chronic pain: G89.29

## 2023-03-19 HISTORY — DX: Irritable bowel syndrome, unspecified: K58.9

## 2023-03-19 SURGERY — RELEASE, A1 PULLEY, FOR TRIGGER FINGER
Anesthesia: General | Site: Finger | Laterality: Left

## 2023-03-19 MED ORDER — BUPIVACAINE HCL (PF) 0.25 % IJ SOLN
INTRAMUSCULAR | Status: DC | PRN
  Administered 2023-03-19: 9 mL

## 2023-03-19 MED ORDER — ACETAMINOPHEN 10 MG/ML IV SOLN
INTRAVENOUS | Status: DC | PRN
  Administered 2023-03-19: 1000 mg via INTRAVENOUS

## 2023-03-19 MED ORDER — ACETAMINOPHEN 325 MG PO TABS
325.0000 mg | ORAL_TABLET | ORAL | Status: DC | PRN
Start: 2023-03-19 — End: 2023-03-19

## 2023-03-19 MED ORDER — PROPOFOL 10 MG/ML IV BOLUS
INTRAVENOUS | Status: DC | PRN
  Administered 2023-03-19 (×2): 100 mg via INTRAVENOUS

## 2023-03-19 MED ORDER — ACETAMINOPHEN 160 MG/5ML PO SOLN: 325.0000 mg | ORAL | Status: DC | PRN

## 2023-03-19 MED ORDER — ONDANSETRON HCL 4 MG/2ML IJ SOLN
INTRAMUSCULAR | Status: DC | PRN
  Administered 2023-03-19: 4 mg via INTRAVENOUS

## 2023-03-19 MED ORDER — MEPERIDINE HCL 25 MG/ML IJ SOLN: 6.2500 mg | INTRAMUSCULAR | Status: DC | PRN

## 2023-03-19 MED ORDER — LACTATED RINGERS IV SOLN: INTRAVENOUS | Status: DC

## 2023-03-19 MED ORDER — FENTANYL CITRATE (PF) 100 MCG/2ML IJ SOLN
INTRAMUSCULAR | Status: AC
  Filled 2023-03-19: qty 2

## 2023-03-19 MED ORDER — CEFAZOLIN SODIUM-DEXTROSE 2-4 GM/100ML-% IV SOLN
2.0000 g | INTRAVENOUS | Status: AC
  Administered 2023-03-19: 2 g via INTRAVENOUS

## 2023-03-19 MED ORDER — DEXAMETHASONE SODIUM PHOSPHATE 4 MG/ML IJ SOLN
INTRAMUSCULAR | Status: DC | PRN
  Administered 2023-03-19: 5 mg via INTRAVENOUS

## 2023-03-19 MED ORDER — 0.9 % SODIUM CHLORIDE (POUR BTL) OPTIME
TOPICAL | Status: DC | PRN
Start: 2023-03-19 — End: 2023-03-19
  Administered 2023-03-19: 500 mL

## 2023-03-19 MED ORDER — ONDANSETRON HCL 4 MG/2ML IJ SOLN: 4.0000 mg | Freq: Once | INTRAMUSCULAR | Status: DC | PRN

## 2023-03-19 MED ORDER — FENTANYL CITRATE (PF) 100 MCG/2ML IJ SOLN: 25.0000 ug | INTRAMUSCULAR | Status: DC | PRN

## 2023-03-19 MED ORDER — ACETAMINOPHEN 10 MG/ML IV SOLN
INTRAVENOUS | Status: AC
  Filled 2023-03-19: qty 100

## 2023-03-19 MED ORDER — DEXAMETHASONE SODIUM PHOSPHATE 10 MG/ML IJ SOLN
INTRAMUSCULAR | Status: AC
  Filled 2023-03-19: qty 1

## 2023-03-19 MED ORDER — SODIUM CHLORIDE 0.9 % IV SOLN: INTRAVENOUS | Status: DC | PRN

## 2023-03-19 MED ORDER — OXYCODONE HCL 5 MG/5ML PO SOLN: 5.0000 mg | Freq: Once | ORAL | Status: DC | PRN

## 2023-03-19 MED ORDER — OXYCODONE HCL 5 MG PO TABS
5.0000 mg | ORAL_TABLET | Freq: Once | ORAL | Status: DC | PRN
Start: 2023-03-19 — End: 2023-03-19

## 2023-03-19 MED ORDER — DEXMEDETOMIDINE HCL IN NACL 80 MCG/20ML IV SOLN
INTRAVENOUS | Status: DC | PRN
  Administered 2023-03-19: 8 ug via INTRAVENOUS

## 2023-03-19 MED ORDER — FENTANYL CITRATE (PF) 100 MCG/2ML IJ SOLN
INTRAMUSCULAR | Status: DC | PRN
  Administered 2023-03-19 (×2): 50 ug via INTRAVENOUS

## 2023-03-19 MED ORDER — LIDOCAINE 2% (20 MG/ML) 5 ML SYRINGE
INTRAMUSCULAR | Status: AC
  Filled 2023-03-19: qty 5

## 2023-03-19 MED ORDER — ONDANSETRON HCL 4 MG/2ML IJ SOLN
INTRAMUSCULAR | Status: AC
  Filled 2023-03-19: qty 2

## 2023-03-19 MED ORDER — PROPOFOL 500 MG/50ML IV EMUL
INTRAVENOUS | Status: DC | PRN
  Administered 2023-03-19: 125 ug/kg/min via INTRAVENOUS

## 2023-03-19 MED ORDER — LIDOCAINE HCL (CARDIAC) PF 100 MG/5ML IV SOSY
PREFILLED_SYRINGE | INTRAVENOUS | Status: DC | PRN
  Administered 2023-03-19: 50 mg via INTRAVENOUS

## 2023-03-19 MED ORDER — CEFAZOLIN SODIUM-DEXTROSE 2-4 GM/100ML-% IV SOLN
INTRAVENOUS | Status: AC
  Filled 2023-03-19: qty 100

## 2023-03-19 SURGICAL SUPPLY — 26 items
BLADE SURG 15 STRL LF DISP TIS (BLADE) ×4 IMPLANT
BNDG COHESIVE 2X5 TAN ST LF (GAUZE/BANDAGES/DRESSINGS) ×2 IMPLANT
BNDG ESMARK 4X9 LF (GAUZE/BANDAGES/DRESSINGS) ×1 IMPLANT
CHLORAPREP W/TINT 26 (MISCELLANEOUS) ×2 IMPLANT
CORD BIPOLAR FORCEPS 12FT (ELECTRODE) ×2 IMPLANT
COVER BACK TABLE 60X90IN (DRAPES) ×2 IMPLANT
COVER MAYO STAND STRL (DRAPES) ×2 IMPLANT
CUFF TOURN SGL QUICK 18X4 (TOURNIQUET CUFF) ×2 IMPLANT
DRAPE EXTREMITY T 121X128X90 (DISPOSABLE) ×2 IMPLANT
DRAPE SURG 17X23 STRL (DRAPES) ×2 IMPLANT
GAUZE SPONGE 4X4 12PLY STRL (GAUZE/BANDAGES/DRESSINGS) ×2 IMPLANT
GAUZE XEROFORM 1X8 LF (GAUZE/BANDAGES/DRESSINGS) ×2 IMPLANT
GLOVE BIO SURGEON STRL SZ7.5 (GLOVE) ×2 IMPLANT
GLOVE BIOGEL PI IND STRL 8 (GLOVE) ×2 IMPLANT
GOWN STRL REUS W/ TWL LRG LVL3 (GOWN DISPOSABLE) ×2 IMPLANT
GOWN STRL REUS W/TWL XL LVL3 (GOWN DISPOSABLE) ×2 IMPLANT
NDL HYPO 25X1 1.5 SAFETY (NEEDLE) ×1 IMPLANT
NEEDLE HYPO 25X1 1.5 SAFETY (NEEDLE) ×2
NS IRRIG 1000ML POUR BTL (IV SOLUTION) ×2 IMPLANT
PACK BASIN DAY SURGERY FS (CUSTOM PROCEDURE TRAY) ×2 IMPLANT
STOCKINETTE 4X48 STRL (DRAPES) ×2 IMPLANT
SUT ETHILON 4 0 PS 2 18 (SUTURE) ×2 IMPLANT
SYR BULB EAR ULCER 3OZ GRN STR (SYRINGE) ×2 IMPLANT
SYR CONTROL 10ML LL (SYRINGE) ×2 IMPLANT
TOWEL GREEN STERILE FF (TOWEL DISPOSABLE) ×4 IMPLANT
UNDERPAD 30X36 HEAVY ABSORB (UNDERPADS AND DIAPERS) ×2 IMPLANT

## 2023-03-19 NOTE — Transfer of Care (Signed)
 Immediate Anesthesia Transfer of Care Note  Patient: April Simmons  Procedure(s) Performed: RELEASE A-1 PULLEY LEFT INDEX FINGER (Left: Finger)  Patient Location: PACU  Anesthesia Type:General  Level of Consciousness: awake, alert , and oriented  Airway & Oxygen Therapy: Patient Spontanous Breathing and Patient connected to nasal cannula oxygen  Post-op Assessment: Report given to RN and Post -op Vital signs reviewed and stable  Post vital signs: Reviewed and stable  Last Vitals:  Vitals Value Taken Time  BP 103/52 03/19/23 1354  Temp 36.3 C 03/19/23 1354  Pulse 55 03/19/23 1357  Resp 15 03/19/23 1357  SpO2 100 % 03/19/23 1357  Vitals shown include unfiled device data.  Last Pain:  Vitals:   03/19/23 1135  TempSrc: Temporal  PainSc: 0-No pain         Complications: No notable events documented.

## 2023-03-19 NOTE — H&P (Signed)
 April Simmons is an 78 y.o. female.   Chief Complaint: trigger digit HPI: 78 yo female with triggering left index finger with associated annular ligament cyst.  This has been injected without lasting resolution.  She wishes to proceed with surgical trigger release and excision of the annular ligament cyst.  Allergies:  Allergies  Allergen Reactions   Codeine    Nsaids    Adhesive [Tape] Itching    Past Medical History:  Diagnosis Date   Anemia    Arthritis    Chronic back pain    IBS (irritable bowel syndrome)    Multiple environmental allergies     Past Surgical History:  Procedure Laterality Date   ABDOMINAL HYSTERECTOMY  03/12/1996   ANTERIOR CERVICAL DECOMP/DISCECTOMY FUSION N/A 01/07/2013   Procedure: Anterior Cervical Decompression Fusion Cervical Four-Five, Five-Six;  Surgeon: Alm GORMAN Molt, MD;  Location: [facility] NEURO ORS;  Service: Neurosurgery;  Laterality: N/A;  Anterior Cervical Decompression Fusion Cervical Four-Five, Five-Six   APPENDECTOMY  03/13/1999   BREAST SURGERY  [phone]   x4   DILATION AND CURETTAGE OF UTERUS     x3   SHOULDER ARTHROSCOPY Bilateral    TONSILLECTOMY  03/12/1969    Family History: History reviewed. No pertinent family history.  Social History:   reports that she quit smoking about 44 years ago. Her smoking use included cigarettes. She started smoking about 47 years ago. She has never used smokeless tobacco. She reports that she does not currently use alcohol . She reports that she does not use drugs.  Medications: Medications Prior to Admission  Medication Sig Dispense Refill   acetaminophen  (TYLENOL ) 500 MG tablet Take 500 mg by mouth every 6 (six) hours as needed. PRN     Bioflavonoid Products (ESTER C PO) Take 1 tablet by mouth daily.     Calcium Carb-Cholecalciferol (CALCIUM 1000 + D) 1000-800 MG-UNIT TABS Take by mouth.     docusate sodium (COLACE) 50 MG capsule Take 50 mg by mouth 2 (two) times daily.     estradiol (ESTRACE)  0.1 MG/GM vaginal cream Place 1 Applicatorful vaginally once a week. On mondays     fluconazole (DIFLUCAN) 200 MG tablet Take 200 mg by mouth once a week. On fridays     fluticasone (FLONASE) 50 MCG/ACT nasal spray Place 2 sprays into the nose daily as needed.     halobetasol (ULTRAVATE) 0.05 % cream Apply 1 application topically once a week. On mondays     hydrocortisone (ANUSOL-HC) 25 MG suppository Place 25 mg vaginally every Monday. Inserts onto the vagina on Monday nights for vaginal inflammation.     ipratropium (ATROVENT) 0.06 % nasal spray      levocetirizine (XYZAL) 5 MG tablet Take 5 mg by mouth every evening.     montelukast (SINGULAIR) 10 MG tablet Take 10 mg by mouth at bedtime.     MYRBETRIQ 50 MG TB24 tablet      rosuvastatin (CRESTOR) 10 MG tablet      Saccharomyces boulardii (PROBIOTIC) 250 MG CAPS Take by mouth.     traMADol (ULTRAM) 50 MG tablet 100 mg 2 (two) times daily.     traZODone (DESYREL) 100 MG tablet       No results found for this or any previous visit (from the past 48 hours).  No results found.    Blood pressure 133/69, pulse (!) 58, temperature 98.1 F (36.7 C), temperature source Temporal, resp. rate 16, height 5' 4 (1.626 m), weight 62 kg, SpO2 100%.  General appearance: alert, cooperative, and appears stated age Head: Normocephalic, without obvious abnormality, atraumatic Neck: supple, symmetrical, trachea midline Extremities: Intact sensation and capillary refill all digits.  +epl/fpl/io.  No wounds.  Skin: Skin color, texture, turgor normal. No rashes or lesions Neurologic: Grossly normal Incision/Wound: none  Assessment/Plan Left index finger trigger digit and annular ligament cyst.  Non operative and operative treatment options have been discussed with the patient and patient wishes to proceed with operative treatment. Risks, benefits and alternatives of surgery were discussed including risks of blood loss, infection, damage to  nerves/vessels/tendons/ligament/bone, failure of surgery, need for additional surgery, complication with wound healing, stiffness, recurrence.  She voiced understanding of these risks and elected to proceed.    Suad Autrey 03/19/2023, 1:05 PM

## 2023-03-19 NOTE — Op Note (Signed)
 03/19/2023 [facility]  Operative Note  PREOPERATIVE DIAGNOSIS: trigger left index finger with retinacular cyst  POSTOPERATIVE DIAGNOSIS:  trigger left index finger with retinacular cyst  PROCEDURE: Procedure(s): RELEASE A-1 PULLEY LEFT INDEX FINGER   SURGEON:  Franky Curia, MD  ASSISTANT:  none.  ANESTHESIA:  General.  IV FLUIDS:  Per anesthesia flow sheet.  ESTIMATED BLOOD LOSS:  Minimal.  COMPLICATIONS:  None.  SPECIMENS:  Tenosynovium to pathology  TOURNIQUET TIME:  Total Tourniquet Time Documented: Upper Arm (Left) - 12 minutes Total: Upper Arm (Left) - 12 minutes   DISPOSITION:  Stable to PACU.  LOCATION:  SURGERY CENTER  INDICATIONS: April Simmons is a 78 y.o. female with triggering of the index finger.  This has been injected without lasting resolution.  She wishes to proceed with surgical trigger release.  She also notes a nodule in the area that is bothersome and she would like to have removed.  Risks, benefits and alternatives of surgery were discussed including the risk of blood loss, infection, damage to nerves, vessels, tendons, ligaments, bone, failure of surgery, need for additional surgery, complications with wound healing, continued pain, continued triggering and need for repeat surgery.  She voiced understanding of these risks and elected to proceed.  OPERATIVE COURSE:  After being identified preoperatively by myself, the patient and I agreed upon the procedure and site of procedure.  The surgical site was marked. Surgical consent had been signed. She was given IV Ancef  as preoperative antibiotic prophylaxis. She was transported to the operating room and placed on the operating room table in supine position with the Left upper extremity on an arm board. General anesthesia was induced by the anesthesiologist.  The Left upper extremity was prepped and draped in normal sterile orthopedic fashion. A surgical pause was performed between  surgeons, anesthesia, and operating room staff, and all were in agreement as to the patient, procedure, and site of procedure.  Tourniquet at the proximal aspect of the extremity was inflated to 250 mmHg after exsanguination of the arm with an Esmarch bandage.  An incision was made at the volar aspect of the MP joint of the index finger.  This was carried into the subcutaneous tissues by spreading technique.  Bipolar electrocautery was used to obtain hemostasis.  The radial and ulnar digital nerves were protected throughout the case. The flexor sheath was identified.  The A1 pulley was identified and sharply incised.  It was released in its entirety.  The proximal 1-2 mm of the A2 pulley was vented to allow better excursion of the tendons.  The finger was placed through a range of motion and there was noted to be no catching.  The tendons were brought through the wound and any adherences released.  There was thickened synovium around the tendons which was removed with the scissors and sent to pathology.  There was no retinacular cyst identified.  The area was palpated and no masses were palpable.  The wound was then copiously irrigated with sterile saline. It was closed with 4-0 nylon in a horizontal mattress fashion.  It was injected with 0.25% plain Marcaine  to aid in postoperative analgesia.  It was dressed with sterile Xeroform, 4x4s, and wrapped lightly with a Coban dressing.  Tourniquet was deflated at 12 minutes.  The fingertips were pink with brisk capillary refill after deflation of the tourniquet.  The operative drapes were broken down and the patient was awoken from anesthesia safely.  She was transferred back to the  stretcher and taken to the PACU in stable condition.   I will see her back in the office in 1 week for postoperative followup.  She states she has tramadol at home already and plans to use that for post operative pain control.    Roberth Berling, MD Electronically signed, 03/19/23

## 2023-03-19 NOTE — Anesthesia Preprocedure Evaluation (Addendum)
 Anesthesia Evaluation  Patient identified by MRN, date of birth, ID band Patient awake    Reviewed: Allergy & Precautions, H&P , NPO status , Patient's Chart, lab work & pertinent test results, reviewed documented beta blocker date and time   Airway Mallampati: II  TM Distance: >3 FB Neck ROM: Full    Dental no notable dental hx. (+) Teeth Intact, Dental Advisory Given, Caps   Pulmonary former smoker   Pulmonary exam normal breath sounds clear to auscultation       Cardiovascular hypertension, Pt. on medications  Rhythm:Regular Rate:Normal     Neuro/Psych negative neurological ROS  negative psych ROS   GI/Hepatic negative GI ROS, Neg liver ROS,,,  Endo/Other  negative endocrine ROS    Renal/GU negative Renal ROS  negative genitourinary   Musculoskeletal  (+) Arthritis , Osteoarthritis,    Abdominal   Peds  Hematology  (+) Blood dyscrasia, anemia   Anesthesia Other Findings   Reproductive/Obstetrics negative OB ROS                             Anesthesia Physical Anesthesia Plan  ASA: 2  Anesthesia Plan: General   Post-op Pain Management: Celebrex PO (pre-op)* and Tylenol  PO (pre-op)*   Induction: Intravenous  PONV Risk Score and Plan: 3 and Ondansetron , Treatment may vary due to age or medical condition and TIVA  Airway Management Planned: LMA  Additional Equipment: None  Intra-op Plan:   Post-operative Plan: Extubation in OR  Informed Consent: I have reviewed the patients History and Physical, chart, labs and discussed the procedure including the risks, benefits and alternatives for the proposed anesthesia with the patient or authorized representative who has indicated his/her understanding and acceptance.     Dental advisory given  Plan Discussed with: CRNA and Anesthesiologist  Anesthesia Plan Comments:         Anesthesia Quick Evaluation

## 2023-03-19 NOTE — Anesthesia Procedure Notes (Signed)
 Procedure Name: LMA Insertion Date/Time: 03/19/2023 1:25 PM  Performed by: Buster Catheryn SAUNDERS, CRNAPre-anesthesia Checklist: Patient identified, Emergency Drugs available, Suction available and Patient being monitored Patient Re-evaluated:Patient Re-evaluated prior to induction Oxygen Delivery Method: Circle system utilized Preoxygenation: Pre-oxygenation with 100% oxygen Induction Type: IV induction Ventilation: Mask ventilation without difficulty LMA: LMA inserted LMA Size: 4.0 Number of attempts: 1 Placement Confirmation: positive ETCO2 Tube secured with: Tape Dental Injury: Teeth and Oropharynx as per pre-operative assessment

## 2023-03-19 NOTE — Discharge Instructions (Addendum)

## 2023-03-20 ENCOUNTER — Encounter (HOSPITAL_BASED_OUTPATIENT_CLINIC_OR_DEPARTMENT_OTHER): Payer: Self-pay | Admitting: Orthopedic Surgery

## 2023-03-20 LAB — SURGICAL PATHOLOGY

## 2023-03-20 NOTE — Anesthesia Postprocedure Evaluation (Signed)
 Anesthesia Post Note  Patient: April Simmons  Procedure(s) Performed: RELEASE A-1 PULLEY LEFT INDEX FINGER (Left: Finger)     Patient location during evaluation: PACU Anesthesia Type: General Level of consciousness: awake and alert Pain management: pain level controlled Vital Signs Assessment: post-procedure vital signs reviewed and stable Respiratory status: spontaneous breathing, nonlabored ventilation, respiratory function stable and patient connected to nasal cannula oxygen Cardiovascular status: blood pressure returned to baseline and stable Postop Assessment: no apparent nausea or vomiting Anesthetic complications: no   No notable events documented.  Last Vitals:  Vitals:   03/19/23 1415 03/19/23 1437  BP: (!) 138/124 (!) 130/91  Pulse: (!) 59 64  Resp: 12 16  Temp:  (!) 36.2 C  SpO2: 97%     Last Pain:  Vitals:   03/19/23 1437  TempSrc: Temporal  PainSc: 0-No pain                 Jazmon Kos

## 2023-03-26 DIAGNOSIS — M65322 Trigger finger, left index finger: Secondary | ICD-10-CM | POA: Diagnosis not present

## 2023-03-26 DIAGNOSIS — M67442 Ganglion, left hand: Secondary | ICD-10-CM | POA: Diagnosis not present

## 2023-04-02 DIAGNOSIS — M65322 Trigger finger, left index finger: Secondary | ICD-10-CM | POA: Diagnosis not present

## 2023-04-16 DIAGNOSIS — Z1211 Encounter for screening for malignant neoplasm of colon: Secondary | ICD-10-CM | POA: Diagnosis not present

## 2023-04-16 DIAGNOSIS — Z800 Family history of malignant neoplasm of digestive organs: Secondary | ICD-10-CM | POA: Diagnosis not present

## 2023-04-16 DIAGNOSIS — Z1509 Genetic susceptibility to other malignant neoplasm: Secondary | ICD-10-CM | POA: Diagnosis not present

## 2023-04-16 DIAGNOSIS — K5730 Diverticulosis of large intestine without perforation or abscess without bleeding: Secondary | ICD-10-CM | POA: Diagnosis not present

## 2023-04-16 DIAGNOSIS — Z860100 Personal history of colon polyps, unspecified: Secondary | ICD-10-CM | POA: Diagnosis not present

## 2023-04-30 DIAGNOSIS — M67442 Ganglion, left hand: Secondary | ICD-10-CM | POA: Diagnosis not present

## 2023-04-30 DIAGNOSIS — M65322 Trigger finger, left index finger: Secondary | ICD-10-CM | POA: Diagnosis not present

## 2023-04-30 DIAGNOSIS — L910 Hypertrophic scar: Secondary | ICD-10-CM | POA: Diagnosis not present

## 2023-05-21 DIAGNOSIS — D2371 Other benign neoplasm of skin of right lower limb, including hip: Secondary | ICD-10-CM | POA: Diagnosis not present

## 2023-05-21 DIAGNOSIS — L812 Freckles: Secondary | ICD-10-CM | POA: Diagnosis not present

## 2023-05-21 DIAGNOSIS — D1801 Hemangioma of skin and subcutaneous tissue: Secondary | ICD-10-CM | POA: Diagnosis not present

## 2023-05-21 DIAGNOSIS — D225 Melanocytic nevi of trunk: Secondary | ICD-10-CM | POA: Diagnosis not present

## 2023-05-21 DIAGNOSIS — B078 Other viral warts: Secondary | ICD-10-CM | POA: Diagnosis not present

## 2023-05-21 DIAGNOSIS — D2272 Melanocytic nevi of left lower limb, including hip: Secondary | ICD-10-CM | POA: Diagnosis not present

## 2023-05-21 DIAGNOSIS — L821 Other seborrheic keratosis: Secondary | ICD-10-CM | POA: Diagnosis not present

## 2023-05-22 DIAGNOSIS — Z1509 Genetic susceptibility to other malignant neoplasm: Secondary | ICD-10-CM | POA: Diagnosis not present

## 2023-05-22 DIAGNOSIS — Z800 Family history of malignant neoplasm of digestive organs: Secondary | ICD-10-CM | POA: Diagnosis not present

## 2023-05-22 DIAGNOSIS — Z1211 Encounter for screening for malignant neoplasm of colon: Secondary | ICD-10-CM | POA: Diagnosis not present

## 2023-05-22 DIAGNOSIS — K5730 Diverticulosis of large intestine without perforation or abscess without bleeding: Secondary | ICD-10-CM | POA: Diagnosis not present

## 2023-05-22 DIAGNOSIS — Z860100 Personal history of colon polyps, unspecified: Secondary | ICD-10-CM | POA: Diagnosis not present

## 2023-05-22 DIAGNOSIS — Z860101 Personal history of adenomatous and serrated colon polyps: Secondary | ICD-10-CM | POA: Diagnosis not present

## 2023-06-13 DIAGNOSIS — E785 Hyperlipidemia, unspecified: Secondary | ICD-10-CM | POA: Diagnosis not present

## 2023-06-13 DIAGNOSIS — N1831 Chronic kidney disease, stage 3a: Secondary | ICD-10-CM | POA: Diagnosis not present

## 2023-06-20 DIAGNOSIS — N3946 Mixed incontinence: Secondary | ICD-10-CM | POA: Diagnosis not present

## 2023-06-20 DIAGNOSIS — Z1509 Genetic susceptibility to other malignant neoplasm: Secondary | ICD-10-CM | POA: Diagnosis not present

## 2023-06-20 DIAGNOSIS — Z1339 Encounter for screening examination for other mental health and behavioral disorders: Secondary | ICD-10-CM | POA: Diagnosis not present

## 2023-06-20 DIAGNOSIS — Z860100 Personal history of colon polyps, unspecified: Secondary | ICD-10-CM | POA: Diagnosis not present

## 2023-06-20 DIAGNOSIS — E785 Hyperlipidemia, unspecified: Secondary | ICD-10-CM | POA: Diagnosis not present

## 2023-06-20 DIAGNOSIS — Z1331 Encounter for screening for depression: Secondary | ICD-10-CM | POA: Diagnosis not present

## 2023-06-20 DIAGNOSIS — I700 Atherosclerosis of aorta: Secondary | ICD-10-CM | POA: Diagnosis not present

## 2023-06-20 DIAGNOSIS — G4700 Insomnia, unspecified: Secondary | ICD-10-CM | POA: Diagnosis not present

## 2023-06-20 DIAGNOSIS — N1831 Chronic kidney disease, stage 3a: Secondary | ICD-10-CM | POA: Diagnosis not present

## 2023-06-20 DIAGNOSIS — Z0000 Encounter for general adult medical examination without abnormal findings: Secondary | ICD-10-CM | POA: Diagnosis not present

## 2023-06-20 DIAGNOSIS — Z23 Encounter for immunization: Secondary | ICD-10-CM | POA: Diagnosis not present

## 2023-07-10 DIAGNOSIS — L910 Hypertrophic scar: Secondary | ICD-10-CM | POA: Diagnosis not present

## 2023-07-10 DIAGNOSIS — M65322 Trigger finger, left index finger: Secondary | ICD-10-CM | POA: Diagnosis not present

## 2023-07-15 DIAGNOSIS — M5416 Radiculopathy, lumbar region: Secondary | ICD-10-CM | POA: Diagnosis not present

## 2023-07-15 DIAGNOSIS — M51362 Other intervertebral disc degeneration, lumbar region with discogenic back pain and lower extremity pain: Secondary | ICD-10-CM | POA: Diagnosis not present

## 2023-07-15 DIAGNOSIS — R531 Weakness: Secondary | ICD-10-CM | POA: Diagnosis not present

## 2023-07-15 DIAGNOSIS — Z79899 Other long term (current) drug therapy: Secondary | ICD-10-CM | POA: Diagnosis not present

## 2023-07-15 DIAGNOSIS — M79645 Pain in left finger(s): Secondary | ICD-10-CM | POA: Diagnosis not present

## 2023-07-15 DIAGNOSIS — M25642 Stiffness of left hand, not elsewhere classified: Secondary | ICD-10-CM | POA: Diagnosis not present

## 2023-07-15 DIAGNOSIS — M47896 Other spondylosis, lumbar region: Secondary | ICD-10-CM | POA: Diagnosis not present

## 2023-07-16 DIAGNOSIS — M25512 Pain in left shoulder: Secondary | ICD-10-CM | POA: Diagnosis not present

## 2023-07-18 DIAGNOSIS — J3089 Other allergic rhinitis: Secondary | ICD-10-CM | POA: Diagnosis not present

## 2023-07-18 DIAGNOSIS — H1045 Other chronic allergic conjunctivitis: Secondary | ICD-10-CM | POA: Diagnosis not present

## 2023-07-22 DIAGNOSIS — M79645 Pain in left finger(s): Secondary | ICD-10-CM | POA: Diagnosis not present

## 2023-07-22 DIAGNOSIS — M25642 Stiffness of left hand, not elsewhere classified: Secondary | ICD-10-CM | POA: Diagnosis not present

## 2023-07-22 DIAGNOSIS — R531 Weakness: Secondary | ICD-10-CM | POA: Diagnosis not present

## 2023-07-23 DIAGNOSIS — B078 Other viral warts: Secondary | ICD-10-CM | POA: Diagnosis not present

## 2023-07-24 DIAGNOSIS — L900 Lichen sclerosus et atrophicus: Secondary | ICD-10-CM | POA: Diagnosis not present

## 2023-07-24 DIAGNOSIS — N907 Vulvar cyst: Secondary | ICD-10-CM | POA: Diagnosis not present

## 2023-07-24 DIAGNOSIS — N952 Postmenopausal atrophic vaginitis: Secondary | ICD-10-CM | POA: Diagnosis not present

## 2023-07-29 DIAGNOSIS — R531 Weakness: Secondary | ICD-10-CM | POA: Diagnosis not present

## 2023-07-29 DIAGNOSIS — M79645 Pain in left finger(s): Secondary | ICD-10-CM | POA: Diagnosis not present

## 2023-07-29 DIAGNOSIS — M25642 Stiffness of left hand, not elsewhere classified: Secondary | ICD-10-CM | POA: Diagnosis not present

## 2023-08-07 ENCOUNTER — Ambulatory Visit: Attending: Internal Medicine | Admitting: Audiologist

## 2023-08-07 DIAGNOSIS — H903 Sensorineural hearing loss, bilateral: Secondary | ICD-10-CM | POA: Diagnosis not present

## 2023-08-07 NOTE — Procedures (Signed)
  [facility] [address] [place], [place]  [address] [phone]  AUDIOLOGICAL  EVALUATION  NAME: April Simmons     DOB:   [birth year]      MRN: [id number]                                                                                     DATE: 08/07/2023     REFERENT: Azalia Leo, MD STATUS: Outpatient DIAGNOSIS:  Sensorineural Hearing Loss Bilateral    History: April Simmons was seen for an audiological evaluation due to need to monitor her hearing loss. She feels the loss is relatively stable. She struggles to hear people from a distance. In 2024 April Simmons saw [facility] and was  diagnosed with a mild sloping sensorineural hearing loss.  April Simmons denies pain, pressure, or tinnitus.  April Simmons no history of hazardous noise exposure.  Medical history shows no additional or new risk for hearing loss.    Evaluation:  Otoscopy showed a clear view of the tympanic membranes, bilaterally Tympanometry results were consistent with normal middle ear function, bilaterally   Audiometric testing was completed using Conventional Audiometry techniques with insert earphones and supraural headphones. Test results are consistent with mild sloping sensorineural hearing loss bilaterally. Speech Recognition Thresholds were obtained at 25dB HL in the right ear and at 30dB HL in the left ear. Word Recognition Testing was completed at  40dB SL and April Simmons scored 100% in each ear.    Results:  The test results were reviewed with April Simmons and her husband. April Simmons has mild sensorineural hearing loss bilaterally. There is a slight decrease in the left ear compared to 2024 but its not clinically significant. April Simmons needs cerumen removal annually but is not a hearing aid candidate.  Audiogram printed and provided to April Simmons.    Recommendations: Annual audiometric testing recommended to monitor hearing loss for progression. Annual wax removal recommended as well.   18 minutes spent testing  and counseling on results.   If you have any questions please feel free to contact me at [phone].  Raynald Calkins Stalnaker Au.D.  Audiologist   08/07/2023  2:16 PM  Cc: Azalia Leo, MD

## 2023-08-08 DIAGNOSIS — Z961 Presence of intraocular lens: Secondary | ICD-10-CM | POA: Diagnosis not present

## 2023-08-08 DIAGNOSIS — R531 Weakness: Secondary | ICD-10-CM | POA: Diagnosis not present

## 2023-08-08 DIAGNOSIS — M25642 Stiffness of left hand, not elsewhere classified: Secondary | ICD-10-CM | POA: Diagnosis not present

## 2023-08-08 DIAGNOSIS — M79645 Pain in left finger(s): Secondary | ICD-10-CM | POA: Diagnosis not present

## 2023-08-27 DIAGNOSIS — N3946 Mixed incontinence: Secondary | ICD-10-CM | POA: Diagnosis not present

## 2023-08-27 DIAGNOSIS — R351 Nocturia: Secondary | ICD-10-CM | POA: Diagnosis not present

## 2023-09-16 DIAGNOSIS — Z1231 Encounter for screening mammogram for malignant neoplasm of breast: Secondary | ICD-10-CM | POA: Diagnosis not present

## 2023-12-14 DIAGNOSIS — Z23 Encounter for immunization: Secondary | ICD-10-CM | POA: Diagnosis not present

## 2023-12-18 DIAGNOSIS — H6123 Impacted cerumen, bilateral: Secondary | ICD-10-CM | POA: Diagnosis not present

## 2024-01-07 DIAGNOSIS — M25512 Pain in left shoulder: Secondary | ICD-10-CM | POA: Diagnosis not present

## 2024-01-09 DIAGNOSIS — G8929 Other chronic pain: Secondary | ICD-10-CM | POA: Diagnosis not present

## 2024-01-09 DIAGNOSIS — M546 Pain in thoracic spine: Secondary | ICD-10-CM | POA: Diagnosis not present

## 2024-01-15 DIAGNOSIS — J3089 Other allergic rhinitis: Secondary | ICD-10-CM | POA: Diagnosis not present

## 2024-01-15 DIAGNOSIS — H1045 Other chronic allergic conjunctivitis: Secondary | ICD-10-CM | POA: Diagnosis not present

## 2024-01-15 DIAGNOSIS — Z23 Encounter for immunization: Secondary | ICD-10-CM | POA: Diagnosis not present

## 2024-01-30 DIAGNOSIS — M546 Pain in thoracic spine: Secondary | ICD-10-CM | POA: Diagnosis not present

## 2024-01-30 DIAGNOSIS — G8929 Other chronic pain: Secondary | ICD-10-CM | POA: Diagnosis not present

## 2024-02-18 DIAGNOSIS — M5459 Other low back pain: Secondary | ICD-10-CM | POA: Diagnosis not present

## 2024-02-19 DIAGNOSIS — L570 Actinic keratosis: Secondary | ICD-10-CM | POA: Diagnosis not present

## 2024-02-19 DIAGNOSIS — L720 Epidermal cyst: Secondary | ICD-10-CM | POA: Diagnosis not present

## 2024-02-19 DIAGNOSIS — L821 Other seborrheic keratosis: Secondary | ICD-10-CM | POA: Diagnosis not present
